# Patient Record
Sex: Female | Born: 1985
Health system: Southern US, Community
[De-identification: ages and names within clinical notes are randomized; demographics above are authoritative.]

## PROBLEM LIST (undated history)

## (undated) DIAGNOSIS — G43909 Migraine, unspecified, not intractable, without status migrainosus: Secondary | ICD-10-CM

## (undated) DIAGNOSIS — E669 Obesity, unspecified: Secondary | ICD-10-CM

## (undated) DIAGNOSIS — K219 Gastro-esophageal reflux disease without esophagitis: Secondary | ICD-10-CM

## (undated) DIAGNOSIS — B029 Zoster without complications: Secondary | ICD-10-CM

## (undated) HISTORY — PX: OTHER SURGICAL HISTORY: SHX169

## (undated) HISTORY — PX: CHOLECYSTECTOMY: SHX55

## (undated) HISTORY — DX: Obesity, unspecified: E66.9

## (undated) HISTORY — DX: Zoster without complications: B02.9

---

## 2004-03-30 ENCOUNTER — Emergency Department (HOSPITAL_COMMUNITY): Admission: EM | Admit: 2004-03-30 | Discharge: 2004-03-30 | Payer: Self-pay | Admitting: Emergency Medicine

## 2005-08-13 ENCOUNTER — Other Ambulatory Visit: Admission: RE | Admit: 2005-08-13 | Discharge: 2005-08-13 | Payer: Self-pay | Admitting: Obstetrics and Gynecology

## 2006-05-30 ENCOUNTER — Other Ambulatory Visit: Admission: RE | Admit: 2006-05-30 | Discharge: 2006-05-30 | Payer: Self-pay | Admitting: Obstetrics and Gynecology

## 2006-10-06 ENCOUNTER — Ambulatory Visit (HOSPITAL_COMMUNITY): Admission: RE | Admit: 2006-10-06 | Discharge: 2006-10-06 | Payer: Self-pay | Admitting: Family Medicine

## 2006-10-14 ENCOUNTER — Ambulatory Visit (HOSPITAL_COMMUNITY): Admission: RE | Admit: 2006-10-14 | Discharge: 2006-10-14 | Payer: Self-pay | Admitting: Family Medicine

## 2006-11-22 ENCOUNTER — Encounter (INDEPENDENT_AMBULATORY_CARE_PROVIDER_SITE_OTHER): Payer: Self-pay | Admitting: Surgery

## 2006-11-22 ENCOUNTER — Ambulatory Visit (HOSPITAL_COMMUNITY): Admission: RE | Admit: 2006-11-22 | Discharge: 2006-11-22 | Payer: Self-pay | Admitting: Surgery

## 2006-11-25 ENCOUNTER — Encounter: Payer: Self-pay | Admitting: Surgery

## 2006-11-25 ENCOUNTER — Inpatient Hospital Stay (HOSPITAL_COMMUNITY): Admission: AD | Admit: 2006-11-25 | Discharge: 2006-11-28 | Payer: Self-pay | Admitting: Surgery

## 2007-08-14 ENCOUNTER — Inpatient Hospital Stay (HOSPITAL_COMMUNITY): Admission: RE | Admit: 2007-08-14 | Discharge: 2007-08-14 | Payer: Self-pay | Admitting: Obstetrics and Gynecology

## 2007-12-13 ENCOUNTER — Inpatient Hospital Stay (HOSPITAL_COMMUNITY): Admission: AD | Admit: 2007-12-13 | Discharge: 2007-12-15 | Payer: Self-pay | Admitting: Obstetrics and Gynecology

## 2007-12-25 ENCOUNTER — Inpatient Hospital Stay (HOSPITAL_COMMUNITY): Admission: AD | Admit: 2007-12-25 | Discharge: 2007-12-28 | Payer: Self-pay | Admitting: Obstetrics and Gynecology

## 2008-01-17 ENCOUNTER — Emergency Department (HOSPITAL_COMMUNITY): Admission: EM | Admit: 2008-01-17 | Discharge: 2008-01-17 | Payer: Self-pay | Admitting: *Deleted

## 2008-10-01 ENCOUNTER — Emergency Department (HOSPITAL_COMMUNITY): Admission: EM | Admit: 2008-10-01 | Discharge: 2008-10-02 | Payer: Self-pay | Admitting: Emergency Medicine

## 2008-10-23 ENCOUNTER — Emergency Department (HOSPITAL_COMMUNITY): Admission: EM | Admit: 2008-10-23 | Discharge: 2008-10-23 | Payer: Self-pay | Admitting: Emergency Medicine

## 2008-10-25 ENCOUNTER — Encounter: Admission: RE | Admit: 2008-10-25 | Discharge: 2008-10-25 | Payer: Self-pay | Admitting: Gastroenterology

## 2008-11-01 ENCOUNTER — Ambulatory Visit (HOSPITAL_COMMUNITY): Admission: RE | Admit: 2008-11-01 | Discharge: 2008-11-01 | Payer: Self-pay | Admitting: Gastroenterology

## 2009-12-19 ENCOUNTER — Ambulatory Visit: Payer: Self-pay | Admitting: Family

## 2009-12-19 ENCOUNTER — Inpatient Hospital Stay (HOSPITAL_COMMUNITY): Admission: AD | Admit: 2009-12-19 | Discharge: 2009-12-19 | Payer: Self-pay | Admitting: Obstetrics & Gynecology

## 2010-01-20 ENCOUNTER — Inpatient Hospital Stay (HOSPITAL_COMMUNITY)
Admission: AD | Admit: 2010-01-20 | Discharge: 2010-01-22 | Payer: Self-pay | Source: Home / Self Care | Admitting: Obstetrics and Gynecology

## 2010-05-18 ENCOUNTER — Encounter: Payer: Self-pay | Admitting: Gastroenterology

## 2010-07-09 LAB — RPR: RPR Ser Ql: NONREACTIVE

## 2010-07-09 LAB — CBC
HCT: 29.5 % — ABNORMAL LOW (ref 36.0–46.0)
Hemoglobin: 12.4 g/dL (ref 12.0–15.0)
MCH: 27 pg (ref 26.0–34.0)
Platelets: 134 10*3/uL — ABNORMAL LOW (ref 150–400)
Platelets: 97 10*3/uL — ABNORMAL LOW (ref 150–400)
RBC: 3.68 MIL/uL — ABNORMAL LOW (ref 3.87–5.11)
RDW: 14.9 % (ref 11.5–15.5)
WBC: 12.9 10*3/uL — ABNORMAL HIGH (ref 4.0–10.5)

## 2010-07-10 LAB — COMPREHENSIVE METABOLIC PANEL
BUN: 3 mg/dL — ABNORMAL LOW (ref 6–23)
Calcium: 8.5 mg/dL (ref 8.4–10.5)
Chloride: 108 mEq/L (ref 96–112)
Creatinine, Ser: 0.38 mg/dL — ABNORMAL LOW (ref 0.4–1.2)
Glucose, Bld: 89 mg/dL (ref 70–99)
Sodium: 136 mEq/L (ref 135–145)
Total Bilirubin: 0.5 mg/dL (ref 0.3–1.2)
Total Protein: 5.8 g/dL — ABNORMAL LOW (ref 6.0–8.3)

## 2010-07-10 LAB — CBC
HCT: 34.7 % — ABNORMAL LOW (ref 36.0–46.0)
Hemoglobin: 11.7 g/dL — ABNORMAL LOW (ref 12.0–15.0)
MCHC: 33.7 g/dL (ref 30.0–36.0)
RBC: 4.18 MIL/uL (ref 3.87–5.11)
RDW: 14 % (ref 11.5–15.5)

## 2010-07-10 LAB — URINE MICROSCOPIC-ADD ON

## 2010-07-10 LAB — URINALYSIS, ROUTINE W REFLEX MICROSCOPIC
Hgb urine dipstick: NEGATIVE
Leukocytes, UA: NEGATIVE
Protein, ur: NEGATIVE mg/dL
Specific Gravity, Urine: 1.025 (ref 1.005–1.030)

## 2010-07-10 LAB — LIPASE, BLOOD: Lipase: 27 U/L (ref 11–59)

## 2010-07-10 LAB — AMYLASE: Amylase: 52 U/L (ref 0–105)

## 2010-08-03 LAB — WET PREP, GENITAL
Trich, Wet Prep: NONE SEEN
Yeast Wet Prep HPF POC: NONE SEEN

## 2010-08-03 LAB — URINALYSIS, ROUTINE W REFLEX MICROSCOPIC
Bilirubin Urine: NEGATIVE
Bilirubin Urine: NEGATIVE
Glucose, UA: NEGATIVE mg/dL
Glucose, UA: NEGATIVE mg/dL
Hgb urine dipstick: NEGATIVE
Hgb urine dipstick: NEGATIVE
Ketones, ur: NEGATIVE mg/dL
Nitrite: NEGATIVE
Protein, ur: NEGATIVE mg/dL
Protein, ur: NEGATIVE mg/dL
Specific Gravity, Urine: 1.008 (ref 1.005–1.030)
Urobilinogen, UA: 0.2 mg/dL (ref 0.0–1.0)
Urobilinogen, UA: 1 mg/dL (ref 0.0–1.0)
pH: 6 (ref 5.0–8.0)

## 2010-08-03 LAB — CBC
HCT: 38.4 % (ref 36.0–46.0)
HCT: 40.1 % (ref 36.0–46.0)
Hemoglobin: 13 g/dL (ref 12.0–15.0)
MCHC: 33.8 g/dL (ref 30.0–36.0)
MCV: 87.7 fL (ref 78.0–100.0)
Platelets: 248 10*3/uL (ref 150–400)
Platelets: 228 10*3/uL (ref 150–400)
RBC: 4.38 MIL/uL (ref 3.87–5.11)
RDW: 12.4 % (ref 11.5–15.5)
WBC: 9.5 10*3/uL (ref 4.0–10.5)
WBC: 10.9 10*3/uL — ABNORMAL HIGH (ref 4.0–10.5)

## 2010-08-03 LAB — COMPREHENSIVE METABOLIC PANEL WITH GFR
ALT: 12 U/L (ref 0–35)
AST: 20 U/L (ref 0–37)
Albumin: 3.9 g/dL (ref 3.5–5.2)
Alkaline Phosphatase: 157 U/L — ABNORMAL HIGH (ref 39–117)
BUN: 11 mg/dL (ref 6–23)
CO2: 28 meq/L (ref 19–32)
Calcium: 9.2 mg/dL (ref 8.4–10.5)
Chloride: 103 meq/L (ref 96–112)
Creatinine, Ser: 0.68 mg/dL (ref 0.4–1.2)
GFR calc non Af Amer: >60 mL/min
Glucose, Bld: 98 mg/dL (ref 70–99)
Potassium: 3.7 meq/L (ref 3.5–5.1)
Sodium: 139 meq/L (ref 135–145)
Total Bilirubin: 0.5 mg/dL (ref 0.3–1.2)
Total Protein: 7.5 g/dL (ref 6.0–8.3)

## 2010-08-03 LAB — DIFFERENTIAL
Basophils Absolute: 0.1 10*3/uL (ref 0.0–0.1)
Basophils Absolute: 0.1 10*3/uL (ref 0.0–0.1)
Basophils Relative: 1 % (ref 0–1)
Basophils Relative: 1 % (ref 0–1)
Eosinophils Absolute: 0.1 10*3/uL (ref 0.0–0.7)
Eosinophils Absolute: 0.1 10*3/uL (ref 0.0–0.7)
Eosinophils Relative: 1 % (ref 0–5)
Eosinophils Relative: 1 % (ref 0–5)
Lymphocytes Relative: 27 % (ref 12–46)
Lymphs Abs: 2.5 10*3/uL (ref 0.7–4.0)
Monocytes Absolute: 0.4 10*3/uL (ref 0.1–1.0)
Monocytes Absolute: 0.6 10*3/uL (ref 0.1–1.0)
Monocytes Relative: 4 % (ref 3–12)
Monocytes Relative: 6 % (ref 3–12)
Neutro Abs: 6.5 10*3/uL (ref 1.7–7.7)
Neutrophils Relative %: 68 % (ref 43–77)

## 2010-08-03 LAB — POCT PREGNANCY, URINE
Preg Test, Ur: NEGATIVE
Preg Test, Ur: NEGATIVE

## 2010-08-03 LAB — COMPREHENSIVE METABOLIC PANEL
ALT: 10 U/L (ref 0–35)
CO2: 25 mEq/L (ref 19–32)
Calcium: 9.1 mg/dL (ref 8.4–10.5)
Creatinine, Ser: 0.77 mg/dL (ref 0.4–1.2)
GFR calc non Af Amer: >60 mL/min (ref >60)
Glucose, Bld: 112 mg/dL — ABNORMAL HIGH (ref 70–99)

## 2010-08-03 LAB — LIPASE, BLOOD: Lipase: 25 U/L (ref 11–59)

## 2010-09-08 NOTE — Discharge Summary (Signed)
[name], [name] NO.:  [id number]   MEDICAL RECORD NO.:  [id number]          PATIENT TYPE:  INP   LOCATION:  6703                         FACILITY:  [facility]   PHYSICIAN:  Ardeth Sportsman, MD     DATE OF BIRTH:  1985-05-31   DATE OF ADMISSION:  11/25/2006  DATE OF DISCHARGE:  11/28/2006                               DISCHARGE SUMMARY   PRIMARY CARE PHYSICIAN:  Royetta Crochet, MD   SURGEON:  Ardeth Sportsman, MD   DISCHARGE DIAGNOSES:  1. Gastroenteritis.  2. Gastroesophageal reflux disease worsened by narcotic and      nonsteroidal use.  3. Biliary dyskinesia status post laparoscopic cholecystectomy on November 22, 2006.   MAIN DIAGNOSIS:  Gastroenteritis.   SUMMARY OF HOSPITAL COURSE:  [name] is a pleasant 25 year old female  who had worsening abdominal pain and biliary dyskinesia and underwent a  laparoscopic cholecystectomy.  Unfortunately, she started having  worsening upper abdominal pain and nausea and vomiting.  She is also  having flushing and shakes on Percocet.  Based on concerns, despite  normal white count, she did have some slightly increased liver function  tests.  She underwent an ultrasound, which did not seem to be too  revealing, but we asked her to come to the office to be seen.  My  partner, Dr. Ezzard Standing, was concerned, and he and Dr. Luisa Hart agreed to  admit her while I was not available.   She was hydrated.  She underwent a HIDA scan, which was completely  negative for any bile leak.  She underwent a CT scan of the abdomen and  pelvis, which showed no evidence of any perforation, bowel obstruction,  abscess, biloma, or any other concerns.  She was left on parenteral  fluids and narcotic pain medication.  She started having flatus and  bowel movements.  Her abdominal pain decreased.  She was advanced on a  diet, and by the time of discharge seemed to be tolerating a solid diet  rather well.  She was not having any fevers or  chills.  She was walking  around the room well and needing minimal pain medication.   Based on these improvements, we thought it would be reasonable for her  to be discharged with the following instructions:  1. She is to return to the clinic to see me in about 7-10 days with      her original kept appointment.  2. She should avoid work for at least a week until she is more fully      recovered.  3. She should avoid any aspirin or nonsteroidals for several weeks      until she is more fully recovered.  4. She should increase her Aciphex to twice-a-day dosing for the next      week for better reflux control.  5. See can advance her diet to a more regular diet over the next few      days, taking care to stay hydrated and avoid      pushing too much.  6. She should  call us if she has any fever, chills, sweats, worsening      nausea or vomiting, abdominal pain, diarrhea, severe constipation,      or any other concerns.      Ardeth Sportsman, MD  Electronically Signed     SCG/MEDQ  D:  11/28/2006  T:  11/28/2006  Job:  [id number]   cc:   Royetta Crochet, MD

## 2010-09-08 NOTE — Discharge Summary (Signed)
[name], [name]              ACCOUNT NO.:  [id number]   MEDICAL RECORD NO.:  [id number]          PATIENT TYPE:  INP   LOCATION:  9110                          FACILITY:  [facility]   PHYSICIAN:  Gerrit Friends. Aldona Bar, M.D.   DATE OF BIRTH:  [birth year]   DATE OF ADMISSION:  12/25/2007  DATE OF DISCHARGE:  12/28/2007                               DISCHARGE SUMMARY   DISCHARGE DIAGNOSES:  1. Term pregnancy delivered 7 pounds 5 ounces female infant, Apgars 9      and 10.  2. Blood type O positive.   PROCEDURES:  1. Normal spontaneous delivery.  2. Second-degree tear and repair.   SUMMARY:  This 25 year old primigravida was admitted at [redacted] weeks  gestation with ruptured membranes after a relatively benign pregnancy  with the exception of a lot of preterm contractions.  She was  hospitalized in mid August for several days and placed on Procardia.   At the time of her admission, her cervix was 1 cm dilated, 50% effaced,  and the vertex at -2 station and ruptured membranes were documented.  She progressed and ultimately on the morning of December 26, 2007,  delivered a female infant weighing 7 pounds and 5 ounces with Apgars of 9  and 10 over second-degree tear which was repaired without difficulty.  Her postpartum course was uncomplicated.  Her discharge hemoglobin was  11.5 with a white count of 15,300 and a platelet count of 156,000.  On  the morning of December 28, 2007, she was ambulating well, tolerating a  regular diet well, having normal bowel and bladder function, and was  afebrile.  Her breast-feeding was going well and she was desirous of  discharge.  Accordingly, she was given all appropriate instructions per  discharge brochure and understood all instructions well.   DISCHARGE MEDICATIONS:  1. Vitamins - 1 a day as long she is breast-feeding.  2. Lorcet 10/650 1 every 4-6 hours as needed for discomfort.   She will resume her Nexium which she takes in the morning daily for  reflux.   She will return to the office for followup in approximately 4  weeks' time or as needed.   CONDITION ON DISCHARGE:  Improved.      Gerrit Friends. Aldona Bar, M.D.  Electronically Signed     RMW/MEDQ  D:  12/28/2007  T:  12/28/2007  Job:  [id number]

## 2010-09-08 NOTE — H&P (Signed)
NAMEKAMIL, [name]              ACCOUNT NO.:  [id number]   MEDICAL RECORD NO.:  [id number]          PATIENT TYPE:  OUT   LOCATION:  XRAY                         [facility]ACILITY:  [facility]   PHYSICIAN:  Sandria Bales. Ezzard Standing, M.D.  DATE OF BIRTH:  September 27, 1985   DATE OF ADMISSION:  11/25/2006  DATE OF DISCHARGE:                              HISTORY & PHYSICAL   HISTORY OF ILLNESS:  Ms.  [name] is a 25 year old white female who had  history of biliary colic with an abnormal ejection fraction on the  hepatobiliary scan.  She underwent a laparoscopic cholecystectomy on the  29th July, 2008 by Dr. Michaell Cowing.  He was unable to do a cholangiogram at  the time of surgery.  She was discharged to home on the same day, got  better may be for a day, but over the last 2 days, had had increasing  right upper quadrant pain radiating to her back and nausea.   Today, Dr. Michaell Cowing obtained an ultrasound.  I only have a handwritten  report that showed common bile duct of 6.1 mm.  There was no fluid.  Otherwise, negative.  She also had labs done, which showed a white blood  cell count of 8,800, a bilirubin of 0.9, an alk phos of 183, an SGOT of  147, SGPT of 334.  The lipase is 19.  She came to the origin office on  Friday, November 25, 2006 because of worsening abdominal pain in Dr.  Michaell Cowing' office afternoon.   ALLERGIES:  SHE HAS NO KNOWN ALLERGIES.   MEDICATIONS:  The only medicine she was taking was Aciphex.  She is also  taking some nonsteroidal drugs.   REVIEW OF SYSTEMS:  Otherwise uneventful.  She has no significant  cardiac, pulmonary, or urologic problems.  She is accompanied by her  husband.   PHYSICAL EXAMINATION:  VITAL SIGNS:  Her temperature is 97.4, blood  pressure 132/80, pulse is 72.  GENERAL:  She is somewhat flushed in the face.  She is a well-nourished  white female, alert, and cooperative.  NECK:  Supple.  LUNGS:  Clear to auscultation.  HEART:  Regular rate and rhythm without murmur or rub.  She  is not  tachycardic.  She does not look toxic, but she looks somewhat flushed.  ABDOMEN:  Her abdomen showed some present bowel sounds, but she is  tender in the right upper quadrant with bandages over her wound.  EXTREMITIES:  She has good strength in all 4 extremities.  NEUROLOGIC:  Grossly intact.   Dr. Luisa Hart who is on-call was in the office.  He also looked at her  with me and I think she is best served by being admitted for IV  hydration, pain control, and we are going to order a HIDA scan and CT  scan today for further evaluation.   DIAGNOSIS:  Right upper quadrant post cholecystectomy, possible leak.   PLAN:  HIDA scan/CT scan and admission to hospital.  I discussed this  with the patient and her husband, who understand.      Sandria Bales. Ezzard Standing, M.D.  Electronically Signed  DHN/MEDQ  D:  11/25/2006  T:  11/25/2006  Job:  [id number]   cc:   Ardeth Sportsman, MD  Royetta Crochet, MD

## 2010-09-08 NOTE — Op Note (Signed)
[name], [name]              ACCOUNT NO.:  [id number]   MEDICAL RECORD NO.:  [id number]          PATIENT TYPE:  AMB   LOCATION:  SDS                          FACILITY:  [facility]   PHYSICIAN:  Ardeth Sportsman, MD     DATE OF BIRTH:  1985-08-02   DATE OF PROCEDURE:  11/22/2006  DATE OF DISCHARGE:                               OPERATIVE REPORT   PRIMARY CARE PHYSICIAN:  Royetta Crochet, MD   SURGEON:  Ardeth Sportsman, M.D.   ASSISTANT:  None.   PREOPERATIVE DIAGNOSIS:  Biliary dyskinesia, probable chronic  cholecystitis.   POSTOPERATIVE DIAGNOSIS:  Biliary dyskinesia, probable chronic  cholecystitis.   PROCEDURE PERFORMED:  Laparoscopic cholecystectomy.   ANESTHESIA:  1. General anesthesia.  2. Local anesthetic and field block around all port sites.   SPECIMEN:  Gallbladder.   DRAINS:  None.   ESTIMATED BLOOD LOSS:  Less than 5 mL.   COMPLICATIONS:  None apparent.   INDICATION:  [name] is a 25 year old female with classic episodes of  biliary colic and workup, otherwise, negative aside from some mild  gastroesophageal reflux well controlled on proton pump inhibitors.  She  as found to have decreased gallbladder ejection fraction with  reproduction of symptoms.  She had an upper GI study that was negative  as well.   The anatomy and physiology of hepatobiliary and pancreatic function was  explained, pathophysiology of biliary dyskinesia with gallbladder  emptying dysfunction and resulting worsening abdominal pain and  dehydration and calculus cholecystitis.  Risks and options are  discussed.  Recommendations are made for laparoscopic cholecystectomy  with possible intraoperative cholangiogram.  Risks such as stroke, heart  attack, deep venous thrombosis, pulmonary embolism and death were  discussed.  Risks such as bleeding, need for transfusion, wound  infection, abscess, injury to other organs, prolonged pain, bile duct  injuries, need of drainage or stenting  or operative reconstruction was  discussed as well.  Questions answered, and she agreed to proceed.   OPERATIVE FINDINGS:  She had some moderate adhesions of omentum to her  gallbladder but no massive gallbladder wall thickening.  Her gallbladder  was very intrahepatic.  Her cystic duct was extremely stenosed.  It  would not allow a cholangiogram catheter to pass into the common bile  duct.   DESCRIPTION OF PROCEDURE:  Informed consent was confirmed.  The patient  voided just prior to going to the operating room.  She had sequential  compression devices active during the entire case.  She was positioned  supine with both arms tucked.  Her abdomen was prepped and draped in a  sterile fashion.   Entry was gained in the abdomen with the patient in steep Trendelenburg  position and right side up using an optical entry technique with a 5-  mm/0 degree scope.  Camera inspection revealed no intraabdominal injury.  Under direct visualization, 5-mm ports were placed through the umbilicus  and in the right flank.  A 10-mm port was tunneled through the falciform  ligament in the subxiphoid region.   The gallbladder fundus was grasped and elevated cephalad.  Adhesions  were carefully freed off primarily involving the omentum.  The  peritoneal covering between the gallbladder and liver were freed off in  the anteromedial and posterolateral aspects.  Circumferential dissection  was done to free the proximal 1/3 of the gallbladder off of the liver  bed.  Dissection revealed three structures going form the gallbladder  down to the porta hepatis.  Two were pulsatile branches, one going on  the anteromedial and going on the posterolateral wall consistent with  anterior & posterior branches of the cystic artery.  Once clip on the  gallbladder side and two clips slightly proximal were made on these  structures.  These structures were transected with left lone structure  leaving and then going from the  infundibulum down to the porta hepatis,  consistent with a short cystic duct.  Clip was made on the infundibulum  and partial cysticotomy was performed.   A 5-French cholangiocatheter was passed through a subcostal stab  incision and flushed.  Numerous attempts were made to pass the catheter  through the cystic duct but I could not get it to advance more than 5 mm  and I could not get a seal to do a cholangiogram.  Further  skeletonization dissection was done of the cystic duct to try and have  better straighten out.  It was a short cystic duct.  Attempt was made to  gently milk back any possible stones or obstructions at the cystic  duct/common bile duct junction.  However, none of these maneuvers were  successful.  Because the patient did not have any obvious jaundice or  elevated LFTs and the anatomy was otherwise normal, I aborted doing the  cholangiogram.  Cholangiocatheter was removed.  Four clips were made on  the cystic duct stump taking care to stay from the cystic duct junction.  Cystic duct transection was completed pretty much at the infundibulum.  The gallbladder was freed from its remaining attachments on the  gallbladder.  The gallbladder was freed from its remaining attachments  to the liver and removed out the subxiphoid port.  There was a slight  exposure of bile but no stones.  Copious irrigation was done in the  subxiphoid port.  Fascial defect was too small to allow my pinky to pass  so I did not do any more aggressive fascial closure.   Careful hemostasis was made on the liver bed and there was no evidence  of any leak of bile of bleeding on the liver bed or on the surgical  clips.  Copious irrigation, over a liter, was done with clear return  with no evidence of any continued bile or blood contamination.  The  upper three abdominal ports were removed under direct visualization,  there was no bleeding on the peritoneal or skin side.  Capnoperitoneum  was evacuated.   The umbilical port was removed.  The skin port sites  were irrigated copiously with saline and the skin was closed using 4-0  Monocryl stitch.  A sterile dressing was applied.  The patient was  extubated and sent to recovery room in stable condition.    I explained the operative findings to the patient just prior to the  patient's family.  Postoperative instructions have been discussed with  the patient just prior to surgery and also with the family just right  after surgery.  Questions answered and expressed understanding and  appreciation.      Ardeth Sportsman, MD  Electronically Signed  SCG/MEDQ  D:  11/22/2006  T:  11/22/2006  Job:  [id number]   cc:   Royetta Crochet, MD  Melida Quitter, M.D.

## 2010-09-11 NOTE — Discharge Summary (Signed)
NAMELAPRECIOUS, AUSTILL NO.:  [id number]   MEDICAL RECORD NO.:  [id number]          PATIENT TYPE:  INP   LOCATION:  9156                          FACILITY:  [facility]   PHYSICIAN:  Ilda Mori, M.D.   DATE OF BIRTH:  November 06, 1985   DATE OF ADMISSION:  12/13/2007  DATE OF DISCHARGE:  12/15/2007                               DISCHARGE SUMMARY   FINAL DIAGNOSIS:  Preterm labor.   SECONDARY DIAGNOSIS:  A 34-week pregnancy.   PROCEDURES:  Tocolysis for preterm labor.   CONDITION ON DISCHARGE:  Stable.   This is a 25 year old gravida 1 with estimated date of delivery 09/22,  at 34-1/2 weeks who was admitted through the emergency room with regular  uterine contractions.  Attempts to stop her labor with Procardia in MAU  were unsuccessful, and the patient was admitted for magnesium therapy.  On admission, the patient's cervix was closed, but her lower uterine  segment seemed well developed.  The patient was observed and treated for  48 hours.  She did dilate at the end of the first day to 1 cm, 60%  effaced.  However, on the third hospital day, she was stable.  There was  no cervical change, and her contractions stopped.  The magnesium was  stopped in the morning of December 15, 2007 and after 6 hours when it was  clear that she was stable she was discharged.  She was sent home on  Procardia 10 mg every 6-8 hours as needed, told to eat a regular diet,  limit her activities, and to return to the office in 3 days.   LABORATORY DATA:  Group B strep came back negative.  An ultrasound was  performed, which showed a 5-pound, 13-ounce baby that appeared to be  normal with normal amniotic fluid volume.  Her fetal fibronectin did  come back positive.      Ilda Mori, M.D.  Electronically Signed     RK/MEDQ  D:  02/01/2008  T:  02/02/2008  Job:  [id number]

## 2011-01-19 LAB — URINALYSIS, ROUTINE W REFLEX MICROSCOPIC
Glucose, UA: NEGATIVE
Hgb urine dipstick: NEGATIVE
Ketones, ur: NEGATIVE
Protein, ur: NEGATIVE
Urobilinogen, UA: 0.2

## 2011-01-25 LAB — COMPREHENSIVE METABOLIC PANEL
Albumin: 3.4 — ABNORMAL LOW
Alkaline Phosphatase: 169 — ABNORMAL HIGH
BUN: 7
Calcium: 9.4
Glucose, Bld: 96
Potassium: 3 — ABNORMAL LOW
Total Protein: 6.2

## 2011-01-25 LAB — POCT CARDIAC MARKERS
CKMB, poc: <1 — ABNORMAL LOW
Myoglobin, poc: 36.4
Troponin i, poc: <0.05

## 2011-01-25 LAB — DIFFERENTIAL
Lymphocytes Relative: 22
Lymphs Abs: 1.8
Monocytes Absolute: 0.4
Monocytes Relative: 5
Neutro Abs: 6
Neutrophils Relative %: 72

## 2011-01-25 LAB — POCT PREGNANCY, URINE: Preg Test, Ur: NEGATIVE

## 2011-01-25 LAB — CBC
HCT: 36.2
Hemoglobin: 12.1
MCHC: 33.6
Platelets: 233
RDW: 12.3

## 2011-01-27 LAB — CBC
Hemoglobin: 11.5 — ABNORMAL LOW
MCHC: 33.8
RBC: 3.81 — ABNORMAL LOW
WBC: 15.3 — ABNORMAL HIGH

## 2011-02-08 LAB — CBC
HCT: 40.7
HCT: 41.7
MCHC: 33.9
MCHC: 34.4
MCHC: 34.2
MCV: 86.9
MCV: 86.6
Platelets: 270
Platelets: 278
RBC: 4.81
RDW: 12.2
WBC: 8.4
WBC: 7.7

## 2011-02-08 LAB — DIFFERENTIAL
Basophils Relative: 0
Eosinophils Absolute: 0.1
Lymphs Abs: 2.7
Monocytes Absolute: 0.5
Monocytes Relative: 6
Neutro Abs: 5.5
Neutrophils Relative %: 62

## 2011-02-08 LAB — HEPATIC FUNCTION PANEL
AST: 157 — ABNORMAL HIGH
Albumin: 3.5
Alkaline Phosphatase: 205 — ABNORMAL HIGH
Total Bilirubin: 0.7
Total Protein: 6.4

## 2011-02-08 LAB — COMPREHENSIVE METABOLIC PANEL
ALT: 208 — ABNORMAL HIGH
ALT: 334 — ABNORMAL HIGH
AST: 48 — ABNORMAL HIGH
Albumin: 3.6
Albumin: 3.9
Alkaline Phosphatase: 179 — ABNORMAL HIGH
Alkaline Phosphatase: 183 — ABNORMAL HIGH
CO2: 27
Calcium: 9.5
Chloride: 102
Creatinine, Ser: 0.57
GFR calc Af Amer: >60
GFR calc non Af Amer: >60
Potassium: 3.7
Potassium: 4.1
Sodium: 134 — ABNORMAL LOW
Sodium: 138
Total Bilirubin: 0.4
Total Protein: 7.5

## 2011-02-08 LAB — HCG, SERUM, QUALITATIVE: Preg, Serum: NEGATIVE

## 2011-04-17 ENCOUNTER — Emergency Department
Admission: EM | Admit: 2011-04-17 | Discharge: 2011-04-17 | Disposition: A | Discharge status: Home / Self Care | Payer: 59 | Source: Home / Self Care | Attending: Emergency Medicine | Admitting: Emergency Medicine

## 2011-04-17 ENCOUNTER — Encounter: Payer: Self-pay | Admitting: Emergency Medicine

## 2011-04-17 DIAGNOSIS — J069 Acute upper respiratory infection, unspecified: Secondary | ICD-10-CM

## 2011-04-17 DIAGNOSIS — J209 Acute bronchitis, unspecified: Secondary | ICD-10-CM

## 2011-04-17 DIAGNOSIS — R059 Cough, unspecified: Secondary | ICD-10-CM

## 2011-04-17 DIAGNOSIS — R05 Cough: Secondary | ICD-10-CM

## 2011-04-17 HISTORY — DX: Migraine, unspecified, not intractable, without status migrainosus: G43.909

## 2011-04-17 MED ORDER — AZITHROMYCIN 250 MG PO TABS: ORAL_TABLET | ORAL | Status: AC

## 2011-04-17 MED ORDER — GUAIFENESIN-CODEINE 100-10 MG/5ML PO SYRP: 5.0000 mL | ORAL_SOLUTION | Freq: Four times a day (QID) | ORAL | Status: AC | PRN

## 2011-04-17 NOTE — ED Provider Notes (Signed)
History     CSN: [id number]  Arrival date & time 04/17/11  1000   First MD Initiated Contact with Patient 04/17/11 1031      Chief Complaint  Patient presents with  . Cough    (Consider location/radiation/quality/duration/timing/severity/associated sxs/prior treatment) HPI [name] is a 25 y.o. female who complains of onset of cold symptoms for 2-3 days. She did not have a flu shot, however she went [facility] to [facility] and they gave her an inhaler as well as a flu test was negative. + sore throat + cough No pleuritic pain No wheezing + nasal congestion + post-nasal drainage + sinus pain/pressure + chest congestion No itchy/red eyes + earache No hemoptysis No SOB + chills/sweats + fever No nausea No vomiting No abdominal pain No diarrhea No skin rashes + fatigue + myalgias + headache    Past Medical History  Diagnosis Date  . Asthma   . Migraines     Past Surgical History  Procedure Date  . Knee surgeries     No family history on file.  History  Substance Use Topics  . Smoking status: Never Smoker   . Smokeless tobacco: Not on file  . Alcohol Use: No    OB History    Grav Para Term Preterm Abortions TAB SAB Ect Mult Living                  Review of Systems  Allergies  Dilaudid and Morphine and related  Home Medications   Current Outpatient Rx  Name Route Sig Dispense Refill  . ALBUTEROL SULFATE HFA 108 (90 BASE) MCG/ACT IN AERS Inhalation Inhale 2 puffs into the lungs every 6 (six) hours as needed.      Marland Kitchen ESOMEPRAZOLE MAGNESIUM 20 MG PO CPDR Oral Take 20 mg by mouth daily before breakfast.      . NORGESTIMATE-ETH ESTRADIOL 0.25-35 MG-MCG PO TABS Oral Take 1 tablet by mouth daily.        BP 118/79  Pulse 103  Temp(Src) 98.3 F (36.8 C) (Oral)  Resp 16  Ht 5\' 5"  (1.651 m)  Wt 205 lb (92.987 kg)  BMI 34.11 kg/m2  SpO2 99%  LMP 04/10/2011  Physical Exam  Nursing note and vitals reviewed. Constitutional: She is  oriented to person, place, and time. She appears well-developed and well-nourished.  HENT:  Head: Normocephalic and atraumatic.  Right Ear: Tympanic membrane, external ear and ear canal normal.  Left Ear: Tympanic membrane, external ear and ear canal normal.  Nose: Mucosal edema and rhinorrhea present.  Mouth/Throat: Posterior oropharyngeal erythema present. No oropharyngeal exudate or posterior oropharyngeal edema.  Eyes: No scleral icterus.  Neck: Neck supple.  Cardiovascular: Regular rhythm and normal heart sounds.   Pulmonary/Chest: Effort normal and breath sounds normal. No respiratory distress.  Neurological: She is alert and oriented to person, place, and time.  Skin: Skin is warm and dry.  Psychiatric: She has a normal mood and affect. Her speech is normal.    ED Course  Procedures (including critical care time)  Labs Reviewed - No data to display No results found.   No diagnosis found.    MDM  1)  Take the prescribed antibiotic as instructed.  She certainly has signs and symptoms of influenza, however she has had a negative flu test and treat her instead for bronchitis. Even if this fluid has been 3 days for Tamiflu. 2)  Use nasal saline solution (over the counter) at least 3 times a  day. 3)  Use over the counter decongestants like Zyrtec-D every 12 hours as needed to help with congestion.  If you have hypertension, do not take medicines with sudafed.  4)  Can take tylenol every 6 hours or motrin every 8 hours for pain or fever. 5)  Follow up with your primary doctor if no improvement in 5-7 days, sooner if increasing pain, fever, or new symptoms.       Lily Kocher, MD 04/17/11 [phone]

## 2011-04-17 NOTE — ED Notes (Signed)
Coughing, fever, fatigue, ear pain, congestion x 24 hours. No Flu vaccine this season.

## 2011-06-21 ENCOUNTER — Emergency Department (INDEPENDENT_AMBULATORY_CARE_PROVIDER_SITE_OTHER)
Admission: EM | Admit: 2011-06-21 | Discharge: 2011-06-21 | Disposition: A | Discharge status: Home / Self Care | Payer: 59 | Source: Home / Self Care | Attending: Family Medicine | Admitting: Family Medicine

## 2011-06-21 ENCOUNTER — Encounter: Payer: Self-pay | Admitting: Emergency Medicine

## 2011-06-21 DIAGNOSIS — G43819 Other migraine, intractable, without status migrainosus: Secondary | ICD-10-CM

## 2011-06-21 DIAGNOSIS — M542 Cervicalgia: Secondary | ICD-10-CM

## 2011-06-21 DIAGNOSIS — G43119 Migraine with aura, intractable, without status migrainosus: Secondary | ICD-10-CM

## 2011-06-21 MED ORDER — KETOROLAC TROMETHAMINE 60 MG/2ML IM SOLN
60.0000 mg | Freq: Once | INTRAMUSCULAR | Status: AC
  Administered 2011-06-21: 60 mg via INTRAMUSCULAR

## 2011-06-21 MED ORDER — ONDANSETRON HCL 4 MG PO TABS
4.0000 mg | ORAL_TABLET | Freq: Once | ORAL | Status: AC
  Administered 2011-06-21: 4 mg via ORAL

## 2011-06-21 NOTE — ED Provider Notes (Signed)
History     CSN: [id number]  Arrival date & time 06/21/11  0807   First MD Initiated Contact with Patient 06/21/11 0827      Chief Complaint  Patient presents with  . Migraine     HPI Comments: Patient has a long history of recurring right side migraine headaches, followed by headache specialist Dr. Gaynell Face.  Over the past two months her headaches have gradually become more frequent, now occurring every 3 to 5 days; in between headaches she feels constantly off balance and dizzy.  In the past she would try Ibuprofen at onset of a headache, sometimes helpful. Her present headache started 6 days ago, longer than usual, and has not responded to an injection of Sumavel DosePro.  She also has had nausea, without vomiting.   She has been prescribed several prophylactic daily meds in the past, none of which has been helpful she states. She states that her headaches always begin in her right neck, beneath her right ear, and the area usually feels somewhat swollen.  The headache then gradually involves the right side of her head.  She denies pain with chewing.  No fevers, chills, and sweats She has not had any scanning studies.  Patient is a 26 y.o. female presenting with migraine. The history is provided by the patient.  Migraine This is a recurrent problem. Episode onset: 6 days ago. The problem occurs constantly. The problem has not changed since onset.Associated symptoms include headaches. Pertinent negatives include no chest pain and no abdominal pain. Exacerbated by: light sensitivity. The symptoms are relieved by nothing. Treatments tried: Kellogg. The treatment provided no relief.    Past Medical History  Diagnosis Date  . Asthma   . Migraines     Past Surgical History  Procedure Date  . Knee surgeries     Family History  Problem Relation Age of Onset  . Migraines Other     History  Substance Use Topics  . Smoking status: Never Smoker   . Smokeless tobacco: Not on  file  . Alcohol Use: No    OB History    Grav Para Term Preterm Abortions TAB SAB Ect Mult Living                  Review of Systems  Constitutional: Positive for activity change and appetite change. Negative for fever, chills and fatigue.  HENT: Positive for neck pain and neck stiffness. Negative for hearing loss, ear pain, nosebleeds, congestion, sore throat, facial swelling, rhinorrhea, trouble swallowing, dental problem, postnasal drip, sinus pressure, tinnitus and ear discharge.   Eyes: Positive for photophobia. Negative for visual disturbance.  Respiratory: Negative.   Cardiovascular: Negative for chest pain.  Gastrointestinal: Positive for nausea. Negative for vomiting and abdominal pain.  Genitourinary: Negative.   Skin: Negative.   Neurological: Positive for dizziness, light-headedness and headaches. Negative for seizures, syncope, facial asymmetry, speech difficulty, weakness and numbness.    Allergies  Codeine; Dilaudid; and Morphine and related  Home Medications   Current Outpatient Rx  Name Route Sig Dispense Refill  . ACETAMINOPHEN 325 MG PO TABS Oral Take 650 mg by mouth every 6 (six) hours as needed.    . CYCLOBENZAPRINE HCL ER 15 MG PO CP24 Oral Take 15 mg by mouth daily as needed.    . IBUPROFEN 200 MG PO TABS Oral Take 200 mg by mouth every 6 (six) hours as needed.    . ALBUTEROL SULFATE HFA 108 (90 BASE) MCG/ACT IN AERS Inhalation  Inhale 2 puffs into the lungs every 6 (six) hours as needed.      Marland Kitchen ESOMEPRAZOLE MAGNESIUM 20 MG PO CPDR Oral Take 20 mg by mouth daily before breakfast.      . NORGESTIMATE-ETH ESTRADIOL 0.25-35 MG-MCG PO TABS Oral Take 1 tablet by mouth daily.        BP 112/79  Pulse 79  Temp(Src) 98.6 F (37 C) (Oral)  Resp 16  Ht 5\' 5"  (1.651 m)  Wt 204 lb (92.534 kg)  BMI 33.95 kg/m2  SpO2 98%  LMP 06/14/2011  Physical Exam  Nursing note and vitals reviewed. Constitutional: She is oriented to person, place, and time. She appears  well-developed and well-nourished. No distress.  HENT:  Head: Normocephalic and atraumatic.    Right Ear: Tympanic membrane and external ear normal.  Left Ear: Tympanic membrane and external ear normal.  Nose: Nose normal.  Mouth/Throat: Oropharynx is clear and moist. No oropharyngeal exudate.       There is distinct tenderness over the right mastoid process and right lateral neck.  No swelling or adenopathy, however.   There is no temporal artery tenderness, and no right TMJ tenderness  Eyes: Conjunctivae and EOM are normal. Pupils are equal, round, and reactive to light.       Fundi are benign  Neck: Normal range of motion. Neck supple. No thyromegaly present.  Cardiovascular: Normal rate, regular rhythm and normal heart sounds.  Exam reveals no gallop and no friction rub.   No murmur heard. Pulmonary/Chest: Effort normal and breath sounds normal. No respiratory distress.  Abdominal: Soft. There is no tenderness.  Musculoskeletal: She exhibits no edema.  Lymphadenopathy:    She has no cervical adenopathy.  Neurological: She is alert and oriented to person, place, and time. She has normal reflexes. She displays normal reflexes. No cranial nerve deficit. She exhibits normal muscle tone. Coordination normal.  Skin: Skin is warm and dry. No rash noted.  Psychiatric: She has a normal mood and affect. Her behavior is normal. Thought content normal.    ED Course  Procedures  none      1. Migraine variant, intractable   2. Cervical pain       MDM  Toradol 60mg  IM, and Zofran 4mg  po Arranged MRI neck with contrast, and MRI brain without contrast today.  Results:  Normal MRI of brain.                Normal MR angiography of the carotid and vertebral arteries  Notified patient.  Advised to follow-up with her headache specialist.      Donna Christen, MD 06/21/11 [phone]

## 2011-06-21 NOTE — ED Notes (Signed)
Migraine x 6 days," worse headache I've ever had"

## 2011-06-25 ENCOUNTER — Telehealth: Payer: Self-pay | Admitting: *Deleted

## 2011-07-05 ENCOUNTER — Emergency Department
Admission: EM | Admit: 2011-07-05 | Discharge: 2011-07-05 | Disposition: A | Discharge status: Home / Self Care | Payer: 59 | Source: Home / Self Care | Attending: Family Medicine | Admitting: Family Medicine

## 2011-07-05 DIAGNOSIS — J069 Acute upper respiratory infection, unspecified: Secondary | ICD-10-CM

## 2011-07-05 MED ORDER — BENZONATATE 200 MG PO CAPS: 200.0000 mg | ORAL_CAPSULE | Freq: Every day | ORAL | Status: AC

## 2011-07-05 MED ORDER — AZITHROMYCIN 250 MG PO TABS: ORAL_TABLET | ORAL | Status: AC

## 2011-07-05 NOTE — ED Notes (Signed)
Patient complains of a sore throat x 3 days. She has cough with green sputum, hoarseness, nasal congestion and bilateral ear pressure for 2 days.

## 2011-07-05 NOTE — Discharge Instructions (Signed)
Take Mucinex D (guaifenesin with decongestant) twice daily for congestion.  Increase fluid intake, rest. °May use Afrin nasal spray (or generic oxymetazoline) twice daily for about 5 days.  Also recommend using saline nasal spray several times daily and saline nasal irrigation (AYR is a common brand) °Stop all antihistamines for now, and other non-prescription cough/cold preparations. °Begin Azithromycin if not improving about 5 days or if persistent fever develops. °Follow-up with family doctor if not improving 7 to 10 days.  °

## 2011-07-05 NOTE — ED Provider Notes (Signed)
History     CSN: [id number]  Arrival date & time 07/05/11  1900   First MD Initiated Contact with Patient 07/05/11 1932      Chief Complaint  Patient presents with  . Sore Throat    x 3 days      HPI Comments: Patient complains of approximately 3 day history of gradually progressive URI symptoms beginning with a mild sore throat (now persistent), followed by progressive nasal congestion.  A cough started yesterday.   Complains of fatigue and initial myalgias.  Cough is now worse at night and generally non-productive during the day.  There has been no pleuritic pain, shortness of breath, or wheezes.   The history is provided by the patient.    Past Medical History  Diagnosis Date  . Asthma   . Migraines     Past Surgical History  Procedure Date  . Knee surgeries     Family History  Problem Relation Age of Onset  . Migraines Other   . Bipolar disorder Mother   . Diabetes Other   . Heart failure Other   . Heart attack Other   . Diabetes Other     type 1  . Heart attack Other     History  Substance Use Topics  . Smoking status: Never Smoker   . Smokeless tobacco: Not on file  . Alcohol Use: No    OB History    Grav Para Term Preterm Abortions TAB SAB Ect Mult Living                  Review of Systems + sore throat + cough No pleuritic pain No wheezing + nasal congestion + post-nasal drainage No sinus pain/pressure No itchy/red eyes + earache No hemoptysis No SOB No fever/chills + nausea No vomiting No abdominal pain No diarrhea No urinary symptoms No skin rashes + fatigue + myalgias No headache Used OTC meds without relief  Allergies  Codeine; Dilaudid; and Morphine and related  Home Medications   Current Outpatient Rx  Name Route Sig Dispense Refill  . ACETAMINOPHEN 325 MG PO TABS Oral Take 650 mg by mouth every 6 (six) hours as needed.    . ALBUTEROL SULFATE HFA 108 (90 BASE) MCG/ACT IN AERS Inhalation Inhale 2 puffs into the lungs  every 6 (six) hours as needed.      . CYCLOBENZAPRINE HCL ER 15 MG PO CP24 Oral Take 15 mg by mouth daily as needed.    Marland Kitchen ESOMEPRAZOLE MAGNESIUM 20 MG PO CPDR Oral Take 20 mg by mouth daily before breakfast.      . IBUPROFEN 200 MG PO TABS Oral Take 200 mg by mouth every 6 (six) hours as needed.    Suzzanne Cloud ESTRADIOL 0.25-35 MG-MCG PO TABS Oral Take 1 tablet by mouth daily.      . AZITHROMYCIN 250 MG PO TABS  Take 2 tabs today; then begin one tab once daily for 4 more days (Rx void after 07/13/11) 6 each 0  . BENZONATATE 200 MG PO CAPS Oral Take 1 capsule (200 mg total) by mouth at bedtime. Take as needed for cough 12 capsule 0    BP 122/80  Pulse 87  Temp(Src) 97.7 F (36.5 C) (Oral)  Resp 18  Ht 5\' 5"  (1.651 m)  Wt 205 lb (92.987 kg)  BMI 34.11 kg/m2  SpO2 99%  LMP 06/14/2011  Physical Exam Nursing notes and Vital Signs reviewed. Appearance:  Patient appears stated age, and in no acute  distress.  Patient is obese (BMI 34.2)  Eyes:  Pupils are equal, round, and reactive to light and accomodation.  Extraocular movement is intact.  Conjunctivae are not inflamed  Ears:  Canals normal.  Tympanic membranes normal.  Nose:  Mildly congested turbinates.  No sinus tenderness.    Pharynx:  Minimal erythema Neck:  Supple.  Slightly tender shotty posterior nodes are palpated bilaterally  Lungs:  Clear to auscultation.  Breath sounds are equal.  Heart:  Regular rate and rhythm without murmurs, rubs, or gallops.  Abdomen:  Nontender without masses or hepatosplenomegaly.  Bowel sounds are present.  No CVA or flank tenderness.  Extremities:  No edema.  No calf tenderness Skin:  No rash present.   ED Course  Procedures  none   Labs Reviewed  POCT RAPID STREP A (OFFICE) - Normal      1. Acute upper respiratory infections of unspecified site       MDM  There is no evidence of bacterial infection today.   Treat symptomatically for now  Begin Tessalon at bedtime. Take Mucinex  D (guaifenesin with decongestant) twice daily for congestion.  Increase fluid intake, rest. May use Afrin nasal spray (or generic oxymetazoline) twice daily for about 5 days.  Also recommend using saline nasal spray several times daily and saline nasal irrigation (AYR is a common brand) Stop all antihistamines for now, and other non-prescription cough/cold preparations. Begin Azithromycin if not improving about 5 days or if persistent fever develops (Given a prescription to hold, with an expiration date)  Follow-up with family doctor if not improving 7 to 10 days.         [name], MD 07/06/11 [phone]

## 2011-11-09 ENCOUNTER — Encounter: Payer: Self-pay | Admitting: *Deleted

## 2011-11-09 ENCOUNTER — Emergency Department
Admission: EM | Admit: 2011-11-09 | Discharge: 2011-11-09 | Disposition: A | Discharge status: Home / Self Care | Payer: 59 | Source: Home / Self Care | Attending: Family Medicine | Admitting: Family Medicine

## 2011-11-09 DIAGNOSIS — G43909 Migraine, unspecified, not intractable, without status migrainosus: Secondary | ICD-10-CM

## 2011-11-09 MED ORDER — KETOROLAC TROMETHAMINE 60 MG/2ML IM SOLN
60.0000 mg | Freq: Once | INTRAMUSCULAR | Status: AC
  Administered 2011-11-09: 60 mg via INTRAMUSCULAR

## 2011-11-09 NOTE — ED Provider Notes (Signed)
History     CSN: [id number]  Arrival date & time 11/09/11  4696   First MD Initiated Contact with Patient 11/09/11 2011      Chief Complaint  Patient presents with  . Headache      HPI Comments: Patient c/o HA x 5 days, nausea and neck stiffness today. Denies fever.  She states that she has a long history of migraine headaches that usually occur at onset of her menses.  The headaches are always right-sided, and sometimes respond to ibuprofen 600mg .  She states that if her headaches do not improve after several days, she always has a good response from Toradol.  She has tried preventative medications in the past without success.  Her present headache is typical.    Patient is a 26 y.o. female presenting with migraine. The history is provided by the patient.  Migraine This is a recurrent problem. Episode onset: 5 days ago. The problem occurs constantly. The problem has been gradually worsening. Associated symptoms comments: Nausea without vomiting. Exacerbated by: exposure to light. Nothing relieves the symptoms. Treatments tried: Ibuprofen 600mg . The treatment provided no relief.    Past Medical History  Diagnosis Date  . Asthma   . Migraines     Past Surgical History  Procedure Date  . Knee surgeries     Family History  Problem Relation Age of Onset  . Migraines Other   . Bipolar disorder Mother   . Diabetes Other   . Heart failure Other   . Heart attack Other   . Diabetes Other     type 1  . Heart attack Other     History  Substance Use Topics  . Smoking status: Never Smoker   . Smokeless tobacco: Not on file  . Alcohol Use: No    OB History    Grav Para Term Preterm Abortions TAB SAB Ect Mult Living                  Review of Systems  All other systems reviewed and are negative.    Allergies  Codeine; Dilaudid; and Morphine and related  Home Medications   Current Outpatient Rx  Name Route Sig Dispense Refill  . ACETAMINOPHEN 325 MG PO TABS Oral  Take 650 mg by mouth every 6 (six) hours as needed.    . ALBUTEROL SULFATE HFA 108 (90 BASE) MCG/ACT IN AERS Inhalation Inhale 2 puffs into the lungs every 6 (six) hours as needed.      . CYCLOBENZAPRINE HCL ER 15 MG PO CP24 Oral Take 15 mg by mouth daily as needed.    Marland Kitchen ESOMEPRAZOLE MAGNESIUM 20 MG PO CPDR Oral Take 20 mg by mouth daily before breakfast.      . IBUPROFEN 200 MG PO TABS Oral Take 200 mg by mouth every 6 (six) hours as needed.    Suzzanne Cloud ESTRADIOL 0.25-35 MG-MCG PO TABS Oral Take 1 tablet by mouth daily.        BP 118/81  Pulse 77  Temp 98.1 F (36.7 C) (Oral)  Resp 18  Ht 5\' 4"  (1.626 m)  Wt 205 lb (92.987 kg)  BMI 35.19 kg/m2  SpO2 98%  LMP 11/01/2011  Physical Exam Nursing notes and Vital Signs reviewed. Appearance:  Patient appears stated age, and in no acute distress.  She is alert and oriented.  Patient is obese (BMI 35.2) Eyes:  Pupils are equal, round, and reactive to light and accomodation.  Extraocular movement is intact.  Conjunctivae  are not inflamed.  Fundi are normal.  Ears:  Canals normal.  Tympanic membranes normal.  Nose:  Mildly congested turbinates.  No sinus tenderness.  Mouth/Pharynx:  Normal Neck:  Supple.  No adenopathy or thyromegaly. Lungs:  Clear to auscultation.  Breath sounds are equal.  Heart:  Regular rate and rhythm without murmurs, rubs, or gallops.  Abdomen:  Nontender without masses or hepatosplenomegaly.  Bowel sounds are present.  No CVA or flank tenderness.  Extremities:  No edema.  No calf tenderness Skin:  No rash present.  Neurologic:  Cranial nerves 2 through 12 are normal.  Patellar, achilles, and elbow reflexes are normal.  Cerebellar function is intact (finger-to-nose and rapid alternating hand movement).  Gait and station are normal.   ED Course  Procedures  none      1. Migraine headache; typical for patient.       MDM  Toradol 60mg  IM Continue Zofran as needed for nausea. Suggested that when next  headache occurs, try taking 800mg  Ibuprofen immediately.        [name], MD 11/10/11 1350

## 2011-11-09 NOTE — ED Notes (Signed)
Pt c/o HA x 5 days, nausea and neck stiffness x  Today. Denies fever.

## 2011-11-09 NOTE — Discharge Instructions (Signed)
Continue Zofran as needed for nausea.  Recurrent Migraine Headache You have a recurrent migraine headache. The caregiver can usually provide good relief for this headache. If this headache is the same as your previous migraine headaches, it is safe to treat you without repeating a complete evaluation.  These headaches usually have at least two of the following problems:   They occur on one side of the head, pulsate, and are severe enough to prevent daily activities.   They are aggravated by daily physical activities.  You may have one or more of the following symptoms:   Nausea (feeling sick to your stomach).   Vomiting.   Pain with exposure to bright lights or loud noises.  Most headache sufferers have a family history of migraines. Your headaches may also be related to alcohol and smoking habits. Too much sleep, too little sleep, mood, and anxiety may also play a part. Changing some of these triggers may help you lower the number and level of pain of the headaches. Headaches may be related to menses (female menstruation). There are numerous medications that can prevent these headaches. Your caregiver can help you with a medication or regimen (procedure to follow). If this has been a chronic (long-term) condition, the use of long-term narcotics is not recommended. Using long-term narcotics can cause recurrent migraines. Narcotics are only a temporary measure only. They are used for the infrequent migraine that fails to respond to all other measures. SEEK MEDICAL CARE IF:   You do not get relief from the medications given to you.   You have a recurrence of pain.   This headache begins to differ from past migraine (for example if it is more severe).  SEEK IMMEDIATE MEDICAL CARE IF:  You have a fever.   You have a stiff neck.   You have vision loss or have changes in vision.   You have problems with feeling lightheaded, become faint, or lose your balance.   You have muscular  weakness.   You have loss of muscular control.   You develop severe symptoms different from your first symptoms.   You start losing your balance or have trouble walking.   You feel faint or pass out.  MAKE SURE YOU:   Understand these instructions.   Will watch your condition.   Will get help right away if you are not doing well or get worse.  Document Released: 01/05/2001 Document Revised: 04/01/2011 Document Reviewed: 11/30/2007 [facility] Patient Information 2012 [place], [place].

## 2011-11-12 ENCOUNTER — Telehealth: Payer: Self-pay | Admitting: Family Medicine

## 2012-03-11 ENCOUNTER — Emergency Department (INDEPENDENT_AMBULATORY_CARE_PROVIDER_SITE_OTHER)
Admission: EM | Admit: 2012-03-11 | Discharge: 2012-03-11 | Disposition: A | Discharge status: Home / Self Care | Payer: 59 | Source: Home / Self Care

## 2012-03-11 DIAGNOSIS — R51 Headache: Secondary | ICD-10-CM

## 2012-03-11 DIAGNOSIS — G43909 Migraine, unspecified, not intractable, without status migrainosus: Secondary | ICD-10-CM

## 2012-03-11 MED ORDER — PROMETHAZINE HCL 25 MG/ML IJ SOLN
25.0000 mg | Freq: Four times a day (QID) | INTRAMUSCULAR | Status: DC | PRN
  Administered 2012-03-11 (×2): 25 mg via INTRAMUSCULAR

## 2012-03-11 MED ORDER — PROMETHAZINE HCL 25 MG/ML IJ SOLN: 25.0000 mg | Freq: Four times a day (QID) | INTRAMUSCULAR | Status: DC | PRN

## 2012-03-11 MED ORDER — RIZATRIPTAN BENZOATE 10 MG PO TABS: 10.0000 mg | ORAL_TABLET | ORAL | Status: DC | PRN

## 2012-03-11 MED ORDER — KETOROLAC TROMETHAMINE 60 MG/2ML IM SOLN
60.0000 mg | Freq: Once | INTRAMUSCULAR | Status: AC
  Administered 2012-03-11: 60 mg via INTRAMUSCULAR

## 2012-03-11 NOTE — ED Provider Notes (Signed)
History     CSN: [id number]  Arrival date & time 03/11/12  1755   First MD Initiated Contact with Patient 03/11/12 1756      Chief Complaint  Patient presents with  . Headache    HPI Comments: Pt with baseline hx/o chronic migraines.  Has been seen by PCP and headache clinic about this issue.  Currently not on prophylactic medication.  Has not tolerated many medications in the past.  Has had HA flare over past 2-3 days.  Frontal, band like distribution.  + photophobia and nausea.  Sxs similar to previous migraines in the past.     Patient is a 26 y.o. female presenting with headaches. The history is provided by the patient.  Headache The primary symptoms include headaches. The symptoms began yesterday. The symptoms are unchanged. The neurological symptoms are multifocal.    Past Medical History  Diagnosis Date  . Asthma   . Migraines     Past Surgical History  Procedure Date  . Knee surgeries     Family History  Problem Relation Age of Onset  . Migraines Other   . Bipolar disorder Mother   . Diabetes Other   . Heart failure Other   . Heart attack Other   . Diabetes Other     type 1  . Heart attack Other     History  Substance Use Topics  . Smoking status: Never Smoker   . Smokeless tobacco: Not on file  . Alcohol Use: No    OB History    Grav Para Term Preterm Abortions TAB SAB Ect Mult Living                  Review of Systems  Neurological: Positive for headaches.  All other systems reviewed and are negative.    Allergies  Codeine; Dilaudid; and Morphine and related  Home Medications   Current Outpatient Rx  Name  Route  Sig  Dispense  Refill  . ACETAMINOPHEN 325 MG PO TABS   Oral   Take 650 mg by mouth every 6 (six) hours as needed.         . ALBUTEROL SULFATE HFA 108 (90 BASE) MCG/ACT IN AERS   Inhalation   Inhale 2 puffs into the lungs every 6 (six) hours as needed.           . CYCLOBENZAPRINE HCL ER 15 MG PO CP24    Oral   Take 15 mg by mouth daily as needed.         Marland Kitchen ESOMEPRAZOLE MAGNESIUM 20 MG PO CPDR   Oral   Take 20 mg by mouth daily before breakfast.           . IBUPROFEN 200 MG PO TABS   Oral   Take 200 mg by mouth every 6 (six) hours as needed.         Suzzanne Cloud ESTRADIOL 0.25-35 MG-MCG PO TABS   Oral   Take 1 tablet by mouth daily.           Marland Kitchen RIZATRIPTAN BENZOATE 10 MG PO TABS   Oral   Take 1 tablet (10 mg total) by mouth as needed for migraine. May repeat in 2 hours if needed   10 tablet   0     BP 116/78  Pulse 107  Temp 97.8 F (36.6 C) (Oral)  Resp 20  Ht 5\' 5"  (1.651 m)  Wt 206 lb (93.441 kg)  BMI 34.28 kg/m2  SpO2 97%  Physical Exam  Constitutional: She appears well-developed and well-nourished.  HENT:  Head: Normocephalic and atraumatic.  Right Ear: External ear normal.  Left Ear: External ear normal.  Mouth/Throat: Oropharynx is clear and moist.  Eyes: Conjunctivae normal are normal. Pupils are equal, round, and reactive to light.       + photophobia with funduscopic examination bilaterally   Neck: Normal range of motion. Neck supple.  Cardiovascular: Normal rate and regular rhythm.   Pulmonary/Chest: Effort normal and breath sounds normal.  Abdominal: Soft. Bowel sounds are normal.  Musculoskeletal: Normal range of motion.  Neurological: She is alert. No cranial nerve deficit.  Skin: Skin is warm.  Psychiatric: She has a normal mood and affect.    ED Course  Procedures (including critical care time)  Labs Reviewed - No data to display No results found.   1. Migraine   2. Headache       MDM  Will treat with toradol and phenergan (IM for both).  Discussed general care and neuro red flags.  Prior imaging including MRI and MRA reviewed which were WNL. Rx for phenergan at home.  Broached issue for follow up on migraines here at [facility]. Pt is agreeable to this  Plan for follow up in 1-2 weeks vs. PCP.    The patient and/or  caregiver has been counseled thoroughly with regard to treatment plan and/or medications prescribed including dosage, schedule, interactions, rationale for use, and possible side effects and they verbalize understanding. Diagnoses and expected course of recovery discussed and will return if not improved as expected or if the condition worsens. Patient and/or caregiver verbalized understanding.               Doree Albee, MD 03/11/12 2219

## 2012-03-11 NOTE — ED Notes (Signed)
Hx of migraine...started yesterday

## 2012-03-12 ENCOUNTER — Telehealth: Payer: Self-pay | Admitting: Family Medicine

## 2012-06-29 ENCOUNTER — Emergency Department (INDEPENDENT_AMBULATORY_CARE_PROVIDER_SITE_OTHER)
Admission: EM | Admit: 2012-06-29 | Discharge: 2012-06-29 | Disposition: A | Discharge status: Home / Self Care | Payer: 59 | Source: Home / Self Care | Attending: Family Medicine | Admitting: Family Medicine

## 2012-06-29 ENCOUNTER — Encounter: Payer: Self-pay | Admitting: *Deleted

## 2012-06-29 DIAGNOSIS — G43909 Migraine, unspecified, not intractable, without status migrainosus: Secondary | ICD-10-CM

## 2012-06-29 HISTORY — DX: Gastro-esophageal reflux disease without esophagitis: K21.9

## 2012-06-29 MED ORDER — KETOROLAC TROMETHAMINE 60 MG/2ML IM SOLN
60.0000 mg | Freq: Once | INTRAMUSCULAR | Status: AC
  Administered 2012-06-29: 60 mg via INTRAMUSCULAR

## 2012-06-29 MED ORDER — PROMETHAZINE HCL 25 MG/ML IJ SOLN
25.0000 mg | Freq: Four times a day (QID) | INTRAMUSCULAR | Status: DC | PRN
  Administered 2012-06-29: 25 mg via INTRAMUSCULAR

## 2012-06-29 MED ORDER — RIZATRIPTAN BENZOATE 10 MG PO TABS: 10.0000 mg | ORAL_TABLET | ORAL | Status: DC | PRN

## 2012-06-29 MED ORDER — PROMETHAZINE HCL 25 MG PO TABS: 25.0000 mg | ORAL_TABLET | Freq: Four times a day (QID) | ORAL | Status: DC | PRN

## 2012-06-29 NOTE — ED Provider Notes (Signed)
History     CSN: [id number]  Arrival date & time 06/29/12  1519   First MD Initiated Contact with Patient 06/29/12 1535      Chief Complaint  Patient presents with  . Migraine       HPI Comments: Patient complains of onset of typical migraine headache four days ago with onset of menses.  She states that she occasionally has a headache with menses, but not predictably.  She mas minimal nausea, no vomiting.  She states that previously prescribed Maxalt was quite effective but she ran out of med. She also complains of a "crackling" sensation in her right ear when she chews, with no associated pain or discomfort.  No decrease in hearing.  No pain when chewing.  No sinus congestion.  She admits that she grinds her teeth.  Patient is a 27 y.o. female presenting with migraines. The history is provided by the patient.  Migraine This is a recurrent problem. The current episode started more than 2 days ago. The problem occurs constantly. The problem has not changed since onset.Associated symptoms comments: Mild nausea . Exacerbated by: exposure to light. Nothing relieves the symptoms. Treatments tried: Ibuprofen and SOMA. The treatment provided mild relief.    Past Medical History  Diagnosis Date  . Asthma   . Migraines   . GERD (gastroesophageal reflux disease)     Past Surgical History  Procedure Laterality Date  . Knee surgeries    . Cholecystectomy      Family History  Problem Relation Age of Onset  . Migraines Other   . Bipolar disorder Mother   . Diabetes Other   . Heart failure Other   . Heart attack Other   . Diabetes Other     type 1  . Heart attack Other     History  Substance Use Topics  . Smoking status: Never Smoker   . Smokeless tobacco: Not on file  . Alcohol Use: No    OB History   Grav Para Term Preterm Abortions TAB SAB Ect Mult Living                  Review of Systems  All other systems reviewed and are negative.    Allergies  Codeine;  Dilaudid; Morphine and related; Sudafed; and Tramadol  Home Medications   Current Outpatient Rx  Name  Route  Sig  Dispense  Refill  . acetaminophen (TYLENOL) 325 MG tablet   Oral   Take 650 mg by mouth every 6 (six) hours as needed.         Marland Kitchen albuterol (PROVENTIL HFA;VENTOLIN HFA) 108 (90 BASE) MCG/ACT inhaler   Inhalation   Inhale 2 puffs into the lungs every 6 (six) hours as needed.           . cyclobenzaprine (AMRIX) 15 MG 24 hr capsule   Oral   Take 15 mg by mouth daily as needed.         Marland Kitchen esomeprazole (NEXIUM) 20 MG capsule   Oral   Take 20 mg by mouth daily before breakfast.           . ibuprofen (ADVIL,MOTRIN) 200 MG tablet   Oral   Take 200 mg by mouth every 6 (six) hours as needed.         . norgestimate-ethinyl estradiol (ORTHO-CYCLEN,SPRINTEC,PREVIFEM) 0.25-35 MG-MCG tablet   Oral   Take 1 tablet by mouth daily.           . promethazine (PHENERGAN)  25 MG tablet   Oral   Take 1 tablet (25 mg total) by mouth every 6 (six) hours as needed for nausea.   12 tablet   0   . rizatriptan (MAXALT) 10 MG tablet   Oral   Take 1 tablet (10 mg total) by mouth as needed for migraine. May repeat in 2 hours if needed   10 tablet   1     BP 109/81  Pulse 81  Temp(Src) 98.3 F (36.8 C) (Oral)  Ht 5\' 3"  (1.6 m)  Wt 212 lb (96.163 kg)  BMI 37.56 kg/m2  SpO2 98%  LMP 06/27/2012  Physical Exam Nursing notes and Vital Signs reviewed. Appearance:  Patient appears stated age, and in no acute distress.  Patient is obese (BMI 37.6) Eyes:  Pupils are equal, round, and reactive to light and accomodation.  Extraocular movement is intact.  Conjunctivae are not inflamed.  Fundi benign.  Mild photophobia present.  Ears:  Canals normal.  Tympanic membranes normal.  No TMJ tenderness present. Nose:  Mildly congested turbinates.  No sinus tenderness.    Pharynx:  Normal Neck:  Supple.  No adenopathy.  Mild tenderness left trapezius and sternocleidomastoid muscle   Lungs:  Clear to auscultation.  Breath sounds are equal.  Heart:  Regular rate and rhythm without murmurs, rubs, or gallops.  Skin:  No rash present.  Neurologic:  Cranial nerves 2 through 12 are normal.  Patellar and elbow reflexes are normal.  Cerebellar function is intact (finger-to-nose).  Gait and station are normal.   ED Course  Procedures  none  Labs Reviewed - Tympanogram normal both ears    1. Migraine headache   2. Tinnitus of right ear; suspect TMJ dysfunction       MDM  Toradol 60mg  IM plus Phenergan 25mg  IM.  Refill given for Maxalt (use for next headache); recommend followup with PCP for headache management. Recommend ENT for evaluation of chewing-initiated tinnitus right ear.  Suspect TMJ dysfunction but there is no right side TMJ tenderness.  Patient admits that she grinds her teeth.        [name], MD 06/29/12 [phone]

## 2012-06-29 NOTE — ED Notes (Signed)
Pt c/o migraine x 4 days, LT side of neck and head. She has taken IBF and soma with no relief. She is currently out of maxalt.

## 2012-08-02 ENCOUNTER — Emergency Department
Admission: EM | Admit: 2012-08-02 | Discharge: 2012-08-02 | Disposition: A | Discharge status: Home / Self Care | Payer: 59 | Source: Home / Self Care | Attending: Family Medicine | Admitting: Family Medicine

## 2012-08-02 ENCOUNTER — Encounter: Payer: Self-pay | Admitting: *Deleted

## 2012-08-02 DIAGNOSIS — L723 Sebaceous cyst: Secondary | ICD-10-CM

## 2012-08-02 DIAGNOSIS — L03319 Cellulitis of trunk, unspecified: Secondary | ICD-10-CM

## 2012-08-02 DIAGNOSIS — L089 Local infection of the skin and subcutaneous tissue, unspecified: Secondary | ICD-10-CM

## 2012-08-02 DIAGNOSIS — L0291 Cutaneous abscess, unspecified: Secondary | ICD-10-CM

## 2012-08-02 MED ORDER — DOXYCYCLINE HYCLATE 100 MG PO CAPS: 100.0000 mg | ORAL_CAPSULE | Freq: Two times a day (BID) | ORAL | Status: AC

## 2012-08-02 NOTE — ED Notes (Signed)
Pt c/o RT upper back abscess x 9 days. Denies fever. Pt also c/o body aches.

## 2012-08-02 NOTE — ED Provider Notes (Signed)
History     CSN: [id number]  Arrival date & time 08/02/12  1924   First MD Initiated Contact with Patient 08/02/12 2001      Chief Complaint  Patient presents with  . Abscess   HPI Comments: Was seen for has approximately week and half ago. Patient was placed on Augmentin for soft tissue coverage. Incision and drainage was not done at the time. Patient states the areas progressively gotten more red and swollen. Patient states she's had some mild aches over the past one to 2 days. No fevers or chills. No headache or nuchal rigidity.  Patient is a 27 y.o. female presenting with abscess. The history is provided by the patient.  Abscess Abscess location: R upper back  Abscess quality: induration, painful and redness   Red streaking: no   Duration:  10 days Progression since onset: gradually worsening  Pain details:    Quality:  Pressure and sharp   Severity:  Moderate   Timing:  Constant   Subjective pain progression: gradually worsening  Chronicity:  New Context: not diabetes and not immunosuppression   Relieved by:  Nothing   Past Medical History  Diagnosis Date  . Asthma   . Migraines   . GERD (gastroesophageal reflux disease)     Past Surgical History  Procedure Laterality Date  . Knee surgeries    . Cholecystectomy      Family History  Problem Relation Age of Onset  . Migraines Other   . Bipolar disorder Mother   . Diabetes Other   . Heart failure Other   . Heart attack Other   . Diabetes Other     type 1  . Heart attack Other     History  Substance Use Topics  . Smoking status: Never Smoker   . Smokeless tobacco: Not on file  . Alcohol Use: No    OB History   Grav Para Term Preterm Abortions TAB SAB Ect Mult Living                  Review of Systems  All other systems reviewed and are negative.    Allergies  Codeine; Dilaudid; Morphine and related; Sudafed; and Tramadol  Home Medications   Current Outpatient Rx  Name  Route  Sig   Dispense  Refill  . acetaminophen (TYLENOL) 325 MG tablet   Oral   Take 650 mg by mouth every 6 (six) hours as needed.         Marland Kitchen albuterol (PROVENTIL HFA;VENTOLIN HFA) 108 (90 BASE) MCG/ACT inhaler   Inhalation   Inhale 2 puffs into the lungs every 6 (six) hours as needed.           . cyclobenzaprine (AMRIX) 15 MG 24 hr capsule   Oral   Take 15 mg by mouth daily as needed.         . doxycycline (VIBRAMYCIN) 100 MG capsule   Oral   Take 1 capsule (100 mg total) by mouth 2 (two) times daily.   20 capsule   0   . esomeprazole (NEXIUM) 20 MG capsule   Oral   Take 20 mg by mouth daily before breakfast.           . ibuprofen (ADVIL,MOTRIN) 200 MG tablet   Oral   Take 200 mg by mouth every 6 (six) hours as needed.         . norgestimate-ethinyl estradiol (ORTHO-CYCLEN,SPRINTEC,PREVIFEM) 0.25-35 MG-MCG tablet   Oral   Take 1 tablet  by mouth daily.           . promethazine (PHENERGAN) 25 MG tablet   Oral   Take 1 tablet (25 mg total) by mouth every 6 (six) hours as needed for nausea.   12 tablet   0   . rizatriptan (MAXALT) 10 MG tablet   Oral   Take 1 tablet (10 mg total) by mouth as needed for migraine. May repeat in 2 hours if needed   10 tablet   1     There were no vitals taken for this visit.  Physical Exam  Constitutional: She appears well-developed and well-nourished.  HENT:  Head: Normocephalic and atraumatic.  Eyes: Conjunctivae are normal. Pupils are equal, round, and reactive to light.  Neck: Normal range of motion. Neck supple.  Cardiovascular: Normal rate and regular rhythm.   Pulmonary/Chest: Effort normal.  Abdominal: Soft.  Musculoskeletal: Normal range of motion.  Neurological: She is alert.  Skin: There is erythema.       ED Course  INCISION AND DRAINAGE Date/Time: 08/02/2012 8:18 PM Performed by: Doree Albee Authorized by: Doree Albee Consent: Verbal consent obtained. Risks and benefits: risks, benefits and alternatives  were discussed Patient identity confirmed: verbally with patient Type: abscess Location: R upper back  Anesthesia: local infiltration Local anesthetic: lidocaine 2% with epinephrine Patient sedated: no Scalpel size: 11 Incision type: single with marsupialization Complexity: complex Drainage: purulent Drainage amount: copious Wound treatment: wound left open Packing material: 1/2 in iodoform gauze Patient tolerance: Patient tolerated the procedure well with no immediate complications.   (including critical care time)  Labs Reviewed - No data to display No results found.   1. Cellulitis and abscess   2. Infected sebaceous cyst       MDM  Wound Culture obtained. Will place on doxycycline for soft tissue coverage. Discontinue Augmentin. Return in 2-3 days for packing removal. Drainage consistent with sebaceous-type fluid. Patient will likely need followup with surgery at this becomes a recurrent issue. Discussed general an infectious red flags the patient at length. Followup as needed.      The patient and/or caregiver has been counseled thoroughly with regard to treatment plan and/or medications prescribed including dosage, schedule, interactions, rationale for use, and possible side effects and they verbalize understanding. Diagnoses and expected course of recovery discussed and will return if not improved as expected or if the condition worsens. Patient and/or caregiver verbalized understanding.               Doree Albee, MD 08/02/12 2023

## 2012-08-03 ENCOUNTER — Telehealth: Payer: Self-pay | Admitting: *Deleted

## 2012-08-04 ENCOUNTER — Encounter: Payer: Self-pay | Admitting: *Deleted

## 2012-08-04 ENCOUNTER — Emergency Department
Admission: EM | Admit: 2012-08-04 | Discharge: 2012-08-04 | Disposition: A | Discharge status: Home / Self Care | Payer: 59 | Source: Home / Self Care | Attending: Family Medicine | Admitting: Family Medicine

## 2012-08-04 DIAGNOSIS — Z5189 Encounter for other specified aftercare: Secondary | ICD-10-CM

## 2012-08-04 NOTE — ED Provider Notes (Signed)
History     CSN: [id number]  Arrival date & time 08/04/12  1216   None     Chief Complaint  Patient presents with  . Wound Check    Patient is a 27 y.o. female presenting with wound check. The history is provided by the patient.  Wound Check This is a new problem. Episode onset: 2-3 days ago. The problem has been gradually improving. Pertinent negatives include no chest pain, no abdominal pain, no headaches and no shortness of breath. Associated symptoms comments: No fevers, has had progressively improving pain. Controlled at home with tylenol. . Exacerbated by: direct pressure  The symptoms are relieved by NSAIDs. She has tried acetaminophen for the symptoms. Improvement on treatment: mild-moderate, pain improving   Area has been gradually improving s/p I and D. Some pain, this is also improving. No fever or chills.     Past Medical History  Diagnosis Date  . Asthma   . Migraines   . GERD (gastroesophageal reflux disease)     Past Surgical History  Procedure Laterality Date  . Knee surgeries    . Cholecystectomy      Family History  Problem Relation Age of Onset  . Migraines Other   . Bipolar disorder Mother   . Diabetes Other   . Heart failure Other   . Heart attack Other   . Diabetes Other     type 1  . Heart attack Other     History  Substance Use Topics  . Smoking status: Never Smoker   . Smokeless tobacco: Not on file  . Alcohol Use: No    OB History   Grav Para Term Preterm Abortions TAB SAB Ect Mult Living                  Review of Systems  Respiratory: Negative for shortness of breath.   Cardiovascular: Negative for chest pain.  Gastrointestinal: Negative for abdominal pain.  Neurological: Negative for headaches.  All other systems reviewed and are negative.    Allergies  Codeine; Dilaudid; Morphine and related; Sudafed; and Tramadol  Home Medications   Current Outpatient Rx  Name  Route  Sig  Dispense  Refill  . acetaminophen  (TYLENOL) 325 MG tablet   Oral   Take 650 mg by mouth every 6 (six) hours as needed.         Marland Kitchen albuterol (PROVENTIL HFA;VENTOLIN HFA) 108 (90 BASE) MCG/ACT inhaler   Inhalation   Inhale 2 puffs into the lungs every 6 (six) hours as needed.           . cyclobenzaprine (AMRIX) 15 MG 24 hr capsule   Oral   Take 15 mg by mouth daily as needed.         . doxycycline (VIBRAMYCIN) 100 MG capsule   Oral   Take 1 capsule (100 mg total) by mouth 2 (two) times daily.   20 capsule   0   . esomeprazole (NEXIUM) 20 MG capsule   Oral   Take 20 mg by mouth daily before breakfast.           . ibuprofen (ADVIL,MOTRIN) 200 MG tablet   Oral   Take 200 mg by mouth every 6 (six) hours as needed.         . norgestimate-ethinyl estradiol (ORTHO-CYCLEN,SPRINTEC,PREVIFEM) 0.25-35 MG-MCG tablet   Oral   Take 1 tablet by mouth daily.           . promethazine (PHENERGAN) 25 MG tablet  Oral   Take 1 tablet (25 mg total) by mouth every 6 (six) hours as needed for nausea.   12 tablet   0   . rizatriptan (MAXALT) 10 MG tablet   Oral   Take 1 tablet (10 mg total) by mouth as needed for migraine. May repeat in 2 hours if needed   10 tablet   1     BP 117/80  Pulse 75  Temp(Src) 98.1 F (36.7 C) (Oral)  Resp 16  SpO2 100%  Physical Exam  Constitutional: She appears well-developed and well-nourished.  HENT:  Head: Normocephalic and atraumatic.  Eyes: Conjunctivae are normal. Pupils are equal, round, and reactive to light.  Neck: Normal range of motion.  Cardiovascular: Normal rate, regular rhythm and normal heart sounds.   Pulmonary/Chest: Effort normal and breath sounds normal.  Abdominal: Soft.  Musculoskeletal: Normal range of motion.  Neurological: She is alert.  Skin:     Clinically improving R upper back abscess/infected sebaceous cyst     ED Course  Procedures (including critical care time)  Labs Reviewed - No data to display No results found.   1. Wound  check, abscess       MDM  Clinically improving right upper back abscess. Still with extensive tracking within the wound. Iodoform gauze removed at bedside. Area reanesthetized with 2% lidocaine with epinephrine given sensitivity for partial repacking with iodoform gauze. Continue oral antibiotics. Tolerating this well. Patient has followup with surgery on Monday. Discussed general an infectious red flags. Discuss with patient needed stronger medication for pain. Patient currently declines this, but will call if pain worsens. Followup as needed.     The patient and/or caregiver has been counseled thoroughly with regard to treatment plan and/or medications prescribed including dosage, schedule, interactions, rationale for use, and possible side effects and they verbalize understanding. Diagnoses and expected course of recovery discussed and will return if not improved as expected or if the condition worsens. Patient and/or caregiver verbalized understanding.    '        Doree Albee, MD 08/04/12 1321

## 2012-08-04 NOTE — ED Notes (Signed)
[name] is here for removal of her packing. Denies any chills or fever. Only c/o pain.

## 2012-08-05 LAB — WOUND CULTURE
Gram Stain: NONE SEEN
Organism ID, Bacteria: NO GROWTH

## 2012-08-06 ENCOUNTER — Telehealth: Payer: Self-pay | Admitting: Emergency Medicine

## 2012-08-07 ENCOUNTER — Encounter (INDEPENDENT_AMBULATORY_CARE_PROVIDER_SITE_OTHER): Payer: Self-pay | Admitting: General Surgery

## 2012-08-07 ENCOUNTER — Ambulatory Visit (INDEPENDENT_AMBULATORY_CARE_PROVIDER_SITE_OTHER): Payer: 59 | Admitting: General Surgery

## 2012-08-07 VITALS — BP 112/86 | HR 84 | Temp 97.8°F | Resp 16 | Ht 65.0 in | Wt 213.0 lb

## 2012-08-07 DIAGNOSIS — L723 Sebaceous cyst: Secondary | ICD-10-CM

## 2012-08-07 DIAGNOSIS — L089 Local infection of the skin and subcutaneous tissue, unspecified: Secondary | ICD-10-CM | POA: Insufficient documentation

## 2012-08-07 NOTE — Progress Notes (Signed)
Patient ID: WITNEY HUIE, female   DOB: 07/23/1985, 27 y.o.   MRN: [id number]  Chief Complaint  Patient presents with  . New Evaluation    eval seb cyst - rt shoulder blade    HPI KRYSTYNA CLECKLEY is a 27 y.o. female.   HPI Patient is a 27 year old female who presents with a sebaceous cyst over the right scapula. She stated that there was something that was around the size of a dime for 7 years. It never hurt it never bothered her for the duration of the time until a week ago. It started becoming quite red and painful. She describes the redness and area of pain around 10 cm. She saw her primary care doctor who put her on Augmentin. This did not help. She then was seen in urgent care where she had to have this incised and drained.    Past Medical History  Diagnosis Date  . Asthma   . Migraines   . GERD (gastroesophageal reflux disease)     Past Surgical History  Procedure Laterality Date  . Knee surgeries    . Cholecystectomy      Family History  Problem Relation Age of Onset  . Migraines Other   . Bipolar disorder Mother   . Diabetes Other   . Heart failure Other   . Heart attack Other   . Diabetes Other     type 1  . Heart disease Other   . Heart attack Other   . Heart disease Paternal Grandmother   . Heart disease Paternal Grandfather     Social History History  Substance Use Topics  . Smoking status: Never Smoker   . Smokeless tobacco: Not on file  . Alcohol Use: No    Allergies  Allergen Reactions  . Codeine   . Dilaudid (Hydromorphone Hcl)   . Morphine And Related   . Sudafed (Pseudoephedrine Hcl)   . Tramadol     Current Outpatient Prescriptions  Medication Sig Dispense Refill  . acetaminophen (TYLENOL) 325 MG tablet Take 650 mg by mouth every 6 (six) hours as needed.      Marland Kitchen albuterol (PROVENTIL HFA;VENTOLIN HFA) 108 (90 BASE) MCG/ACT inhaler Inhale 2 puffs into the lungs every 6 (six) hours as needed.        . cyclobenzaprine (AMRIX) 15 MG 24 hr  capsule Take 15 mg by mouth daily as needed.      . doxycycline (VIBRAMYCIN) 100 MG capsule Take 1 capsule (100 mg total) by mouth 2 (two) times daily.  20 capsule  0  . esomeprazole (NEXIUM) 20 MG capsule Take 20 mg by mouth daily before breakfast.        . ibuprofen (ADVIL,MOTRIN) 200 MG tablet Take 200 mg by mouth every 6 (six) hours as needed.      . norgestimate-ethinyl estradiol (ORTHO-CYCLEN,SPRINTEC,PREVIFEM) 0.25-35 MG-MCG tablet Take 1 tablet by mouth daily.        . promethazine (PHENERGAN) 25 MG tablet Take 1 tablet (25 mg total) by mouth every 6 (six) hours as needed for nausea.  12 tablet  0  . rizatriptan (MAXALT) 10 MG tablet Take 1 tablet (10 mg total) by mouth as needed for migraine. May repeat in 2 hours if needed  10 tablet  1   No current facility-administered medications for this visit.    Review of Systems Review of Systems  All other systems reviewed and are negative.    Blood pressure 112/86, pulse 84, temperature 97.8 F (36.6  C), temperature source Temporal, resp. rate 16, height 5\' 5"  (1.651 m), weight 213 lb (96.616 kg).  Physical Exam Physical Exam  Constitutional: She is oriented to person, place, and time. She appears well-developed and well-nourished. No distress.  HENT:  Head: Normocephalic and atraumatic.  Right Ear: External ear normal.  Left Ear: External ear normal.  Eyes: Conjunctivae are normal. Pupils are equal, round, and reactive to light. No scleral icterus.  Neck: Normal range of motion. Neck supple. No tracheal deviation present. No thyromegaly present.  Cardiovascular: Normal rate, regular rhythm and intact distal pulses.   Pulmonary/Chest: Effort normal. No respiratory distress.   She exhibits no tenderness.  Jagged opening 1 cm long, 0.5 cm wide.  Beefy red granulation tissue  Abdominal: Soft. She exhibits no distension.  Musculoskeletal: Normal range of motion.  Lymphadenopathy:    She has no cervical adenopathy.  Neurological:  She is alert and oriented to person, place, and time. Coordination normal.  Skin: Skin is warm and dry. No rash noted. She is not diaphoretic. No erythema. No pallor.  Psychiatric: She has a normal mood and affect. Her behavior is normal. Judgment and thought content normal.    Assessment      Infected sebaceous cyst Sebaceous cyst appears to be healing well. I would not further debride the wound at this time.  I repacked the wound. I advised her to remove the packing in 48 hours. At that point I would shower daily. I would redress the wound with gauze.  We'll see her back in 2 weeks. Identified the patient and these are likely to recur, but at this point now the wound is not clean and has a higher chance of infection to resect this now.      Maleah Rabago 08/07/2012, 11:19 AM

## 2012-08-07 NOTE — Assessment & Plan Note (Signed)
Sebaceous cyst appears to be healing well. I would not further debride the wound at this time.  I repacked the wound. I advised her to remove the packing in 48 hours. At that point I would shower daily. I would redress the wound with gauze.  We'll see her back in 2 weeks. Identified the patient and these are likely to recur, but at this point now the wound is not clean and has a higher chance of infection to resect this now.

## 2012-08-07 NOTE — Patient Instructions (Signed)
Remove dressing and packing in 48 hours.  Then wash wound gently daily.  Dress wound with gauze daily to protect it from lint.  Follow up in 2 weeks.

## 2012-08-21 ENCOUNTER — Encounter (INDEPENDENT_AMBULATORY_CARE_PROVIDER_SITE_OTHER): Payer: 59 | Admitting: General Surgery

## 2012-08-25 ENCOUNTER — Emergency Department
Admission: EM | Admit: 2012-08-25 | Discharge: 2012-08-25 | Disposition: A | Discharge status: Home / Self Care | Payer: 59 | Source: Home / Self Care | Attending: Family Medicine | Admitting: Family Medicine

## 2012-08-25 ENCOUNTER — Encounter: Payer: Self-pay | Admitting: Emergency Medicine

## 2012-08-25 DIAGNOSIS — G43009 Migraine without aura, not intractable, without status migrainosus: Secondary | ICD-10-CM

## 2012-08-25 MED ORDER — KETOROLAC TROMETHAMINE 60 MG/2ML IM SOLN
60.0000 mg | Freq: Once | INTRAMUSCULAR | Status: AC
  Administered 2012-08-25: 60 mg via INTRAMUSCULAR

## 2012-08-25 NOTE — ED Notes (Signed)
Migraine headache started Wednesday, she has taken Maxalt x2 and Soma x2, no releif, pain behind right eye and back of neck

## 2012-08-25 NOTE — ED Provider Notes (Signed)
History     CSN: [id number]  Arrival date & time 08/25/12  0810   None     Chief Complaint  Patient presents with  . Migraine       HPI Comments: Patient complains of onset of a typical right-sided occipital migraine headache two days ago, now involving right temporal area.  She has had no response to Maxalt (2 tabs) and SOMA.  No nausea/vomiting.  No fevers, chills, and sweats.  No history of auras with her migraines.  She notes that her present headache is associated with menses, and she gets a typical migraine approximately every 3 months with menses.  She reports adverse effect in past when she tried propranolol.  Patient is a 27 y.o. female presenting with migraines.  Migraine This is a recurrent problem. Episode onset: 2 days ago. The problem occurs hourly. The problem has been gradually worsening. Associated symptoms comments: none. Exacerbated by: light exposure. Nothing relieves the symptoms. Treatments tried: Maxalt and SOMA. The treatment provided no relief.    Past Medical History  Diagnosis Date  . Asthma   . Migraines   . GERD (gastroesophageal reflux disease)     Past Surgical History  Procedure Laterality Date  . Knee surgeries    . Cholecystectomy      Family History  Problem Relation Age of Onset  . Migraines Other   . Bipolar disorder Mother   . Diabetes Other   . Heart failure Other   . Heart attack Other   . Diabetes Other     type 1  . Heart disease Other   . Heart attack Other   . Heart disease Paternal Grandmother   . Heart disease Paternal Grandfather     History  Substance Use Topics  . Smoking status: Never Smoker   . Smokeless tobacco: Not on file  . Alcohol Use: No    OB History   Grav Para Term Preterm Abortions TAB SAB Ect Mult Living                  Review of Systems  All other systems reviewed and are negative.    Allergies  Codeine; Dilaudid; Morphine and related; Sudafed; and Tramadol  Home Medications    Current Outpatient Rx  Name  Route  Sig  Dispense  Refill  . acetaminophen (TYLENOL) 325 MG tablet   Oral   Take 650 mg by mouth every 6 (six) hours as needed.         Marland Kitchen albuterol (PROVENTIL HFA;VENTOLIN HFA) 108 (90 BASE) MCG/ACT inhaler   Inhalation   Inhale 2 puffs into the lungs every 6 (six) hours as needed.           . cyclobenzaprine (AMRIX) 15 MG 24 hr capsule   Oral   Take 15 mg by mouth daily as needed.         Marland Kitchen esomeprazole (NEXIUM) 20 MG capsule   Oral   Take 20 mg by mouth daily before breakfast.           . ibuprofen (ADVIL,MOTRIN) 200 MG tablet   Oral   Take 200 mg by mouth every 6 (six) hours as needed.         . norgestimate-ethinyl estradiol (ORTHO-CYCLEN,SPRINTEC,PREVIFEM) 0.25-35 MG-MCG tablet   Oral   Take 1 tablet by mouth daily.           . promethazine (PHENERGAN) 25 MG tablet   Oral   Take 1 tablet (25 mg total)  by mouth every 6 (six) hours as needed for nausea.   12 tablet   0   . rizatriptan (MAXALT) 10 MG tablet   Oral   Take 1 tablet (10 mg total) by mouth as needed for migraine. May repeat in 2 hours if needed   10 tablet   1     BP 115/79  Pulse 78  Temp(Src) 98.3 F (36.8 C) (Oral)  Ht 5\' 4"  (1.626 m)  Wt 214 lb (97.07 kg)  BMI 36.72 kg/m2  SpO2 98%  LMP 08/22/2012  Physical Exam Nursing notes and Vital Signs reviewed. Appearance:  Patient appears stated age, and in no acute distress.  Patient is obese (BMI 36.7) Eyes:  Pupils are equal, round, and reactive to light and accomodation.  Extraocular movement is intact.  Conjunctivae are not inflamed.  Mild photophobia present. Ears:  Canals normal.  Tympanic membranes normal.  Nose:   Normal turbinates.  No sinus tenderness.  Pharynx:  Normal Neck:  Supple.  No adenopathy Lungs:  Clear to auscultation.  Breath sounds are equal.  Heart:  Regular rate and rhythm without murmurs, rubs, or gallops.  Skin:  No rash present. Neurologic:  Cranial nerves 2 through 12  are normal.  Patellar, achilles reflexes are normal. Gait and station are normal.    ED Course  Procedures  none      1. Migraine headache without aura       MDM  Toradol 60mg  IM.  May take Phenergan if nausea develops (has Rx at home) Recommend followup with PCP; since migraine headaches occur approximately every 3 months with menses, consider trying Sarafem on a cyclic basis.        [name], MD 08/25/12 [phone]

## 2012-10-02 ENCOUNTER — Encounter: Payer: Self-pay | Admitting: *Deleted

## 2012-10-02 ENCOUNTER — Emergency Department
Admission: EM | Admit: 2012-10-02 | Discharge: 2012-10-02 | Disposition: A | Discharge status: Home / Self Care | Payer: 59 | Source: Home / Self Care

## 2012-10-02 ENCOUNTER — Other Ambulatory Visit: Payer: Self-pay | Admitting: Family Medicine

## 2012-10-02 DIAGNOSIS — J329 Chronic sinusitis, unspecified: Secondary | ICD-10-CM

## 2012-10-02 DIAGNOSIS — H6091 Unspecified otitis externa, right ear: Secondary | ICD-10-CM

## 2012-10-02 DIAGNOSIS — R062 Wheezing: Secondary | ICD-10-CM

## 2012-10-02 DIAGNOSIS — H60399 Other infective otitis externa, unspecified ear: Secondary | ICD-10-CM

## 2012-10-02 MED ORDER — AZITHROMYCIN 250 MG PO TABS: ORAL_TABLET | ORAL | Status: DC

## 2012-10-02 MED ORDER — METHYLPREDNISOLONE ACETATE 80 MG/ML IJ SUSP
80.0000 mg | Freq: Once | INTRAMUSCULAR | Status: AC
  Administered 2012-10-02: 80 mg via INTRAMUSCULAR

## 2012-10-02 MED ORDER — NEOMYCIN-POLYMYXIN-HC 3.5-10000-1 OT SOLN: 3.0000 [drp] | Freq: Four times a day (QID) | OTIC | Status: AC

## 2012-10-02 NOTE — ED Provider Notes (Signed)
History     CSN: [id number]  Arrival date & time 10/02/12  1431   None     Chief Complaint  Patient presents with  . Cough  . Headache  . Facial Pain  . Nasal Congestion   HPI  URI Symptoms Onset: 2 weeks  Description: sinus pressure, nasal congestion, headache, mild wheezing/SOB Modifying factors:  Daughter with similar sxs 2-3 weeks ago   Symptoms Nasal discharge: yes Fever: no Sore throat: no Cough: yes Wheezing: yes Ear pain: no GI symptoms: no Sick contacts: yes  Red Flags  Stiff neck: no Dyspnea: mild Rash: no Swallowing difficulty: no  Sinusitis Risk Factors Headache/face pain: yes Double sickening: yes tooth pain: no  Allergy Risk Factors Sneezing: mild Itchy scratchy throat: no Seasonal symptoms: yes  Flu Risk Factors Headache: yes muscle aches: no severe fatigue: no   Past Medical History  Diagnosis Date  . Asthma   . Migraines   . GERD (gastroesophageal reflux disease)     Past Surgical History  Procedure Laterality Date  . Knee surgeries    . Cholecystectomy      Family History  Problem Relation Age of Onset  . Migraines Other   . Bipolar disorder Mother   . Diabetes Other   . Heart failure Other   . Heart attack Other   . Diabetes Other     type 1  . Heart disease Other   . Heart attack Other   . Heart disease Paternal Grandmother   . Heart disease Paternal Grandfather     History  Substance Use Topics  . Smoking status: Never Smoker   . Smokeless tobacco: Not on file  . Alcohol Use: No    OB History   Grav Para Term Preterm Abortions TAB SAB Ect Mult Living                  Review of Systems  All other systems reviewed and are negative.    Allergies  Codeine; Dilaudid; Morphine and related; Sudafed; and Tramadol  Home Medications   Current Outpatient Rx  Name  Route  Sig  Dispense  Refill  . acetaminophen (TYLENOL) 325 MG tablet   Oral   Take 650 mg by mouth every 6 (six) hours as needed.          Marland Kitchen albuterol (PROVENTIL HFA;VENTOLIN HFA) 108 (90 BASE) MCG/ACT inhaler   Inhalation   Inhale 2 puffs into the lungs every 6 (six) hours as needed.           Marland Kitchen azithromycin (ZITHROMAX) 250 MG tablet      Take 2 tabs PO x 1 dose, then 1 tab PO QD x 4 days   6 tablet   0   . cyclobenzaprine (AMRIX) 15 MG 24 hr capsule   Oral   Take 15 mg by mouth daily as needed.         Marland Kitchen esomeprazole (NEXIUM) 20 MG capsule   Oral   Take 20 mg by mouth daily before breakfast.           . ibuprofen (ADVIL,MOTRIN) 200 MG tablet   Oral   Take 200 mg by mouth every 6 (six) hours as needed.         . neomycin-polymyxin-hydrocortisone (CORTISPORIN) otic solution   Right Ear   Place 3 drops into the right ear 4 (four) times daily.   10 mL   0   . norgestimate-ethinyl estradiol (ORTHO-CYCLEN,SPRINTEC,PREVIFEM) 0.25-35 MG-MCG tablet  Oral   Take 1 tablet by mouth daily.           . promethazine (PHENERGAN) 25 MG tablet   Oral   Take 1 tablet (25 mg total) by mouth every 6 (six) hours as needed for nausea.   12 tablet   0   . rizatriptan (MAXALT) 10 MG tablet   Oral   Take 1 tablet (10 mg total) by mouth as needed for migraine. May repeat in 2 hours if needed   10 tablet   1     BP 131/91  Pulse 97  Temp(Src) 98.1 F (36.7 C) (Oral)  Resp 18  Wt 209 lb (94.802 kg)  BMI 35.86 kg/m2  SpO2 98%  Physical Exam  Constitutional: She appears well-developed and well-nourished.  HENT:  Head: Normocephalic and atraumatic.  R ear canal erythema and tenderness to otoscopic evaluation Mild R TM bulging + bilateral maxillary TTP  +nasal erythema, rhinorrhea bilaterally, + post oropharyngeal erythema    Eyes: Conjunctivae are normal. Pupils are equal, round, and reactive to light.  Neck: Normal range of motion.  Cardiovascular: Normal rate, regular rhythm and normal heart sounds.   Pulmonary/Chest: Effort normal. She has wheezes.  Abdominal: Soft.  Musculoskeletal: Normal  range of motion.  Neurological: She is alert.  Skin: Skin is warm.    ED Course  Procedures (including critical care time)  Labs Reviewed - No data to display No results found.   1. Sinusitis   2. AOM (acute otitis media), right   3. OE (otitis externa), right   4. Wheezing       MDM  Depomedrol 80mg  IM x1 for wheezing  Zpak for lower and upper resp coverage Cortisporin for OE Discussed infectious and resp red flags.  Follow up as needed.      The patient and/or caregiver has been counseled thoroughly with regard to treatment plan and/or medications prescribed including dosage, schedule, interactions, rationale for use, and possible side effects and they verbalize understanding. Diagnoses and expected course of recovery discussed and will return if not improved as expected or if the condition worsens. Patient and/or caregiver verbalized understanding.             Doree Albee, MD 10/02/12 [phone]

## 2012-10-02 NOTE — ED Notes (Signed)
Pt c/o cough, HA, nasal congestion, and sinus pain x 2 wks, worse x today. Denies fever.

## 2012-10-03 ENCOUNTER — Emergency Department (INDEPENDENT_AMBULATORY_CARE_PROVIDER_SITE_OTHER)
Admission: EM | Admit: 2012-10-03 | Discharge: 2012-10-03 | Disposition: A | Discharge status: Home / Self Care | Payer: 59 | Source: Home / Self Care | Attending: Family Medicine | Admitting: Family Medicine

## 2012-10-03 ENCOUNTER — Emergency Department (INDEPENDENT_AMBULATORY_CARE_PROVIDER_SITE_OTHER): Payer: 59

## 2012-10-03 ENCOUNTER — Encounter: Payer: Self-pay | Admitting: *Deleted

## 2012-10-03 DIAGNOSIS — R51 Headache: Secondary | ICD-10-CM

## 2012-10-03 DIAGNOSIS — J3489 Other specified disorders of nose and nasal sinuses: Secondary | ICD-10-CM

## 2012-10-03 DIAGNOSIS — R110 Nausea: Secondary | ICD-10-CM

## 2012-10-03 MED ORDER — KETOROLAC TROMETHAMINE 60 MG/2ML IM SOLN
60.0000 mg | Freq: Once | INTRAMUSCULAR | Status: AC
  Administered 2012-10-03: 60 mg via INTRAMUSCULAR

## 2012-10-03 MED ORDER — PREDNISONE 20 MG PO TABS: 20.0000 mg | ORAL_TABLET | Freq: Two times a day (BID) | ORAL | Status: DC

## 2012-10-03 MED ORDER — ONDANSETRON 4 MG PO TBDP
4.0000 mg | ORAL_TABLET | Freq: Once | ORAL | Status: AC
  Administered 2012-10-03: 4 mg via ORAL

## 2012-10-03 MED ORDER — ONDANSETRON HCL 4 MG PO TABS: 4.0000 mg | ORAL_TABLET | Freq: Four times a day (QID) | ORAL | Status: DC

## 2012-10-03 NOTE — ED Provider Notes (Signed)
History     CSN: [id number]  Arrival date & time 10/03/12  1632   First MD Initiated Contact with Patient 10/03/12 1654      Chief Complaint  Patient presents with  . Headache       HPI Comments: Patient was treated yesterday for sinusitis.  She complains of persistent nausea (without vomiting), frontal headache and sinus pressure.  No neurologic symptoms otherwise.  Patient is a 27 y.o. female presenting with migraines. The history is provided by the patient.  Migraine This is a recurrent problem. The current episode started more than 2 days ago. The problem occurs constantly. The problem has not changed since onset.Associated symptoms comments: Nausea . Nothing relieves the symptoms.    Past Medical History  Diagnosis Date  . Asthma   . Migraines   . GERD (gastroesophageal reflux disease)     Past Surgical History  Procedure Laterality Date  . Knee surgeries    . Cholecystectomy      Family History  Problem Relation Age of Onset  . Migraines Other   . Bipolar disorder Mother   . Diabetes Other   . Heart failure Other   . Heart attack Other   . Diabetes Other     type 1  . Heart disease Other   . Heart attack Other   . Heart disease Paternal Grandmother   . Heart disease Paternal Grandfather     History  Substance Use Topics  . Smoking status: Never Smoker   . Smokeless tobacco: Not on file  . Alcohol Use: No    OB History   Grav Para Term Preterm Abortions TAB SAB Ect Mult Living                  Review of Systems  All other systems reviewed and are negative.    Allergies  Codeine; Dilaudid; Morphine and related; Sudafed; and Tramadol  Home Medications   Current Outpatient Rx  Name  Route  Sig  Dispense  Refill  . acetaminophen (TYLENOL) 325 MG tablet   Oral   Take 650 mg by mouth every 6 (six) hours as needed.         Marland Kitchen albuterol (PROVENTIL HFA;VENTOLIN HFA) 108 (90 BASE) MCG/ACT inhaler   Inhalation   Inhale 2 puffs into the lungs  every 6 (six) hours as needed.           Marland Kitchen azithromycin (ZITHROMAX) 250 MG tablet      Take 2 tabs PO x 1 dose, then 1 tab PO QD x 4 days   6 tablet   0   . cyclobenzaprine (AMRIX) 15 MG 24 hr capsule   Oral   Take 15 mg by mouth daily as needed.         Marland Kitchen esomeprazole (NEXIUM) 20 MG capsule   Oral   Take 20 mg by mouth daily before breakfast.           . ibuprofen (ADVIL,MOTRIN) 200 MG tablet   Oral   Take 200 mg by mouth every 6 (six) hours as needed.         . neomycin-polymyxin-hydrocortisone (CORTISPORIN) otic solution   Right Ear   Place 3 drops into the right ear 4 (four) times daily.   10 mL   0   . norgestimate-ethinyl estradiol (ORTHO-CYCLEN,SPRINTEC,PREVIFEM) 0.25-35 MG-MCG tablet   Oral   Take 1 tablet by mouth daily.           . ondansetron (ZOFRAN)  4 MG tablet   Oral   Take 1 tablet (4 mg total) by mouth every 6 (six) hours. as needed for nausea   12 tablet   0   . predniSONE (DELTASONE) 20 MG tablet   Oral   Take 1 tablet (20 mg total) by mouth 2 (two) times daily. Take with food. Begin Wednesday 10/04/12   10 tablet   0   . promethazine (PHENERGAN) 25 MG tablet   Oral   Take 1 tablet (25 mg total) by mouth every 6 (six) hours as needed for nausea.   12 tablet   0   . rizatriptan (MAXALT) 10 MG tablet   Oral   Take 1 tablet (10 mg total) by mouth as needed for migraine. May repeat in 2 hours if needed   10 tablet   1     BP 107/73  Pulse 70  Temp(Src) 98 F (36.7 C) (Oral)  Resp 16  Ht 5\' 5"  (1.651 m)  Wt 209 lb (94.802 kg)  BMI 34.78 kg/m2  SpO2 98%  LMP 09/27/2012  Physical Exam Nursing notes and Vital Signs reviewed. Appearance:  Patient appears stated age, and in no acute distress.  Patient is obese (BMI 34.8) Eyes:  Pupils are equal, round, and reactive to light and accomodation.  Extraocular movement is intact.  Conjunctivae are not inflamed.  Fundi benign.  No photophobia  Nose:  Congested turbinates.  Maxillary  sinus tenderness is present.  Pharynx:  Normal Neck:  Supple.  No adenopathy Neurologic:  Cranial nerves 2 through 12 are normal.    Skin:  No rash present.   ED Course  Procedures  none   Dg Sinuses Complete  10/03/2012   *RADIOLOGY REPORT*  Clinical Data: Sinus congestion.  Facial pain.  PARANASAL SINUSES - COMPLETE 3 + VIEW  Comparison: None.  Findings: The paranasal sinuses appear clear.  No specific findings of sinusitis.  IMPRESSION:  1. No sinusitis identified.   Original Report Authenticated By: Gaylyn Rong, M.D.     1. Headache(784.0); suspect migraine   2. Nausea alone       MDM  Toradol 60mg  IM and Zofran 4mg  ODT Tomorrow begin prednisone burst. Take Mucinex D (guaifenesin with decongestant) twice daily for congestion.  Increase fluid intake, rest. May use Afrin nasal spray (or generic oxymetazoline) twice daily for about 5 days.  Also recommend using saline nasal spray several times daily and saline nasal irrigation (AYR is a common brand) Followup with Family Doctor if not improved in one week.            [name], MD 10/03/12 [phone]

## 2012-10-03 NOTE — ED Notes (Signed)
Patient c/o headache x 4 days. States she came in yesterday and is doing worse today with the pain.

## 2012-11-02 ENCOUNTER — Encounter: Payer: Self-pay | Admitting: *Deleted

## 2012-11-02 ENCOUNTER — Emergency Department
Admission: EM | Admit: 2012-11-02 | Discharge: 2012-11-02 | Disposition: A | Discharge status: Home / Self Care | Payer: 59 | Source: Home / Self Care | Attending: Family Medicine | Admitting: Family Medicine

## 2012-11-02 DIAGNOSIS — G43909 Migraine, unspecified, not intractable, without status migrainosus: Secondary | ICD-10-CM

## 2012-11-02 MED ORDER — KETOROLAC TROMETHAMINE 60 MG/2ML IM SOLN
60.0000 mg | Freq: Once | INTRAMUSCULAR | Status: AC
  Administered 2012-11-02: 60 mg via INTRAMUSCULAR

## 2012-11-02 MED ORDER — RIZATRIPTAN BENZOATE 10 MG PO TABS: 10.0000 mg | ORAL_TABLET | ORAL | Status: DC | PRN

## 2012-11-02 NOTE — ED Provider Notes (Signed)
History    CSN: [id number] Arrival date & time 11/02/12  1749  First MD Initiated Contact with Patient 11/02/12 1804     Chief Complaint  Patient presents with  . Migraine      HPI Comments: Patient complains of a persistent typical left-sided headache for 10 days that has not responded to Maxalt.  She reports that she is under increased stress with her job. She states that she has an appt with a headache specialist in about two weeks.  Patient is a 27 y.o. female presenting with migraines. The history is provided by the patient.  Migraine This is a recurrent problem. The current episode started more than 1 week ago. The problem occurs constantly. The problem has not changed since onset.Associated symptoms comments: none. The symptoms are aggravated by stress. Nothing relieves the symptoms. Treatments tried: Maxalt. The treatment provided mild relief.   Past Medical History  Diagnosis Date  . Asthma   . Migraines   . GERD (gastroesophageal reflux disease)    Past Surgical History  Procedure Laterality Date  . Knee surgeries    . Cholecystectomy     Family History  Problem Relation Age of Onset  . Migraines Other   . Bipolar disorder Mother   . Diabetes Other   . Heart failure Other   . Heart attack Other   . Diabetes Other     type 1  . Heart disease Other   . Heart attack Other   . Heart disease Paternal Grandmother   . Heart disease Paternal Grandfather    History  Substance Use Topics  . Smoking status: Never Smoker   . Smokeless tobacco: Not on file  . Alcohol Use: No   OB History   Grav Para Term Preterm Abortions TAB SAB Ect Mult Living                 Review of Systems  All other systems reviewed and are negative.    Allergies  Codeine; Dilaudid; Morphine and related; Sudafed; and Tramadol  Home Medications   Current Outpatient Rx  Name  Route  Sig  Dispense  Refill  . carisoprodol (SOMA) 350 MG tablet   Oral   Take 350 mg by mouth 4 (four)  times daily as needed for muscle spasms.         Marland Kitchen acetaminophen (TYLENOL) 325 MG tablet   Oral   Take 650 mg by mouth every 6 (six) hours as needed.         Marland Kitchen albuterol (PROVENTIL HFA;VENTOLIN HFA) 108 (90 BASE) MCG/ACT inhaler   Inhalation   Inhale 2 puffs into the lungs every 6 (six) hours as needed.           Marland Kitchen azithromycin (ZITHROMAX) 250 MG tablet      Take 2 tabs PO x 1 dose, then 1 tab PO QD x 4 days   6 tablet   0   . cyclobenzaprine (AMRIX) 15 MG 24 hr capsule   Oral   Take 15 mg by mouth daily as needed.         Marland Kitchen esomeprazole (NEXIUM) 20 MG capsule   Oral   Take 20 mg by mouth daily before breakfast.           . ibuprofen (ADVIL,MOTRIN) 200 MG tablet   Oral   Take 200 mg by mouth every 6 (six) hours as needed.         . norgestimate-ethinyl estradiol (ORTHO-CYCLEN,SPRINTEC,PREVIFEM) 0.25-35 MG-MCG  tablet   Oral   Take 1 tablet by mouth daily.           . ondansetron (ZOFRAN) 4 MG tablet   Oral   Take 1 tablet (4 mg total) by mouth every 6 (six) hours. as needed for nausea   12 tablet   0   . predniSONE (DELTASONE) 20 MG tablet   Oral   Take 1 tablet (20 mg total) by mouth 2 (two) times daily. Take with food. Begin Wednesday 10/04/12   10 tablet   0   . promethazine (PHENERGAN) 25 MG tablet   Oral   Take 1 tablet (25 mg total) by mouth every 6 (six) hours as needed for nausea.   12 tablet   0   . rizatriptan (MAXALT) 10 MG tablet   Oral   Take 1 tablet (10 mg total) by mouth as needed for migraine. May repeat in 2 hours if needed   10 tablet   1    BP 109/75  Pulse 76  Temp(Src) 98 F (36.7 C) (Oral)  Resp 18  Ht 5\' 5"  (1.651 m)  Wt 205 lb (92.987 kg)  BMI 34.11 kg/m2  SpO2 98%  LMP 10/27/2012 Physical Exam Nursing notes and Vital Signs reviewed. Appearance:  Patient appears healthy, stated age, and in no acute distress.  She is alert and oriented.  Eyes:  Pupils are equal, round, and reactive to light and accomodation.   Extraocular movement is intact.  Conjunctivae are not inflamed  Pharynx:  Normal Neck:  Supple.   Neurologic:  Cranial nerves 2 through 12 are normal.       ED Course  Procedures  none   1. Migraine headache     MDM  Toradol 60mg  IM.  Refill Maxalt Followup headache specialist as planned.  [name], MD 11/02/12 [phone]

## 2012-11-02 NOTE — ED Notes (Signed)
Pt c/o migraine x 10 days. She does report increased stress.

## 2012-12-26 ENCOUNTER — Institutional Professional Consult (permissible substitution): Payer: 59 | Admitting: Nurse Practitioner

## 2013-01-02 ENCOUNTER — Ambulatory Visit (INDEPENDENT_AMBULATORY_CARE_PROVIDER_SITE_OTHER): Payer: 59 | Admitting: Nurse Practitioner

## 2013-01-02 ENCOUNTER — Encounter: Payer: Self-pay | Admitting: Nurse Practitioner

## 2013-01-02 VITALS — BP 104/79 | HR 72 | Ht 64.5 in | Wt 207.0 lb

## 2013-01-02 DIAGNOSIS — G43009 Migraine without aura, not intractable, without status migrainosus: Secondary | ICD-10-CM

## 2013-01-02 MED ORDER — NAPROXEN SODIUM 550 MG PO TABS: 550.0000 mg | ORAL_TABLET | Freq: Two times a day (BID) | ORAL | Status: DC

## 2013-01-02 MED ORDER — ZOLPIDEM TARTRATE 10 MG PO TABS: 10.0000 mg | ORAL_TABLET | Freq: Every evening | ORAL | Status: DC | PRN

## 2013-01-02 MED ORDER — RIZATRIPTAN BENZOATE 10 MG PO TABS: 10.0000 mg | ORAL_TABLET | ORAL | Status: DC | PRN

## 2013-01-02 MED ORDER — VENLAFAXINE HCL ER 37.5 MG PO CP24: 37.5000 mg | ORAL_CAPSULE | Freq: Three times a day (TID) | ORAL | Status: DC

## 2013-01-02 MED ORDER — TRAMADOL HCL 50 MG PO TABS
50.0000 mg | ORAL_TABLET | Freq: Four times a day (QID) | ORAL | Status: DC | PRN
Start: 2013-01-02 — End: 2013-05-01

## 2013-01-02 NOTE — Patient Instructions (Signed)
Migraine Headache A migraine headache is an intense, throbbing pain on one or both sides of your head. A migraine can last for 30 minutes to several hours. CAUSES  The exact cause of a migraine headache is not always known. However, a migraine may be caused when nerves in the brain become irritated and release chemicals that cause inflammation. This causes pain. SYMPTOMS  Pain on one or both sides of your head.  Pulsating or throbbing pain.  Severe pain that prevents daily activities.  Pain that is aggravated by any physical activity.  Nausea, vomiting, or both.  Dizziness.  Pain with exposure to bright lights, loud noises, or activity.  General sensitivity to bright lights, loud noises, or smells. Before you get a migraine, you may get warning signs that a migraine is coming (aura). An aura may include:  Seeing flashing lights.  Seeing bright spots, halos, or zig-zag lines.  Having tunnel vision or blurred vision.  Having feelings of numbness or tingling.  Having trouble talking.  Having muscle weakness. MIGRAINE TRIGGERS  Alcohol.  Smoking.  Stress.  Menstruation.  Aged cheeses.  Foods or drinks that contain nitrates, glutamate, aspartame, or tyramine.  Lack of sleep.  Chocolate.  Caffeine.  Hunger.  Physical exertion.  Fatigue.  Medicines used to treat chest pain (nitroglycerine), birth control pills, estrogen, and some blood pressure medicines. DIAGNOSIS  A migraine headache is often diagnosed based on:  Symptoms.  Physical examination.  A CT scan or MRI of your head. TREATMENT Medicines may be given for pain and nausea. Medicines can also be given to help prevent recurrent migraines.  HOME CARE INSTRUCTIONS  Only take over-the-counter or prescription medicines for pain or discomfort as directed by your caregiver. The use of long-term narcotics is not recommended.  Lie down in a dark, quiet room when you have a migraine.  Keep a journal  to find out what may trigger your migraine headaches. For example, write down:  What you eat and drink.  How much sleep you get.  Any change to your diet or medicines.  Limit alcohol consumption.  Quit smoking if you smoke.  Get 7 to 9 hours of sleep, or as recommended by your caregiver.  Limit stress.  Keep lights dim if bright lights bother you and make your migraines worse. SEEK IMMEDIATE MEDICAL CARE IF:   Your migraine becomes severe.  You have a fever.  You have a stiff neck.  You have vision loss.  You have muscular weakness or loss of muscle control.  You start losing your balance or have trouble walking.  You feel faint or pass out.  You have severe symptoms that are different from your first symptoms. MAKE SURE YOU:   Understand these instructions.  Will watch your condition.  Will get help right away if you are not doing well or get worse. Document Released: 04/12/2005 Document Revised: 07/05/2011 Document Reviewed: 04/02/2011 [employer] Patient Information 2014 [employer].  

## 2013-01-02 NOTE — Progress Notes (Signed)
Diagnosis: Migraine without Aura  History: [name] 27 [id number] comes to [place] office today to discuss her migraine headaches. She has had migraine since she was 27 years old. Her mother is bipolar and aunt has migraine. [facility] and had negative MRI/MRA. Her migraines have gotten worse since the birth of her son [name]. He is not a good sleeper or eater. They rarely get a good nights sleep, often up 2-3 times per night. She has had difficulty with medications in the past and prefers to take as little as possible.      Location: [facility] [place] [facility] [place]  Number of Headache days/month: migraines last for more than 4 hours, can last 2-3 days Severe: 2 Moderate:8 Mild:20  Current Outpatient Prescriptions on File Prior to Visit  Medication Sig Dispense Refill  . acetaminophen (TYLENOL) 325 MG tablet Take 650 mg by mouth every 6 (six) hours as needed.      Marland Kitchen albuterol (PROVENTIL HFA;VENTOLIN HFA) 108 (90 BASE) MCG/ACT inhaler Inhale 2 puffs into the lungs every 6 (six) hours as needed.        . carisoprodol (SOMA) 350 MG tablet Take 350 mg by mouth 4 (four) times daily as needed for muscle spasms.      Marland Kitchen esomeprazole (NEXIUM) 20 MG capsule Take 20 mg by mouth daily before breakfast.        . ibuprofen (ADVIL,MOTRIN) 200 MG tablet Take 200 mg by mouth every 6 (six) hours as needed.      . ondansetron (ZOFRAN) 4 MG tablet Take 1 tablet (4 mg total) by mouth every 6 (six) hours. as needed for nausea  12 tablet  0  . promethazine (PHENERGAN) 25 MG tablet Take 1 tablet (25 mg total) by mouth every 6 (six) hours as needed for nausea.  12 tablet  0  . rizatriptan (MAXALT) 10 MG tablet Take 1 tablet (10 mg total) by mouth as needed for migraine. May repeat in 2 hours if needed  10 tablet  1   No current facility-administered medications on file prior to visit.    Acute or prevention: Has been on Topamax, Zonegran, Inderal, toradol,  maxalt, OTC meds, trigger point injections,   Past Medical History  Diagnosis Date  . Asthma   . Migraines   . GERD (gastroesophageal reflux disease)    Past Surgical History  Procedure Laterality Date  . Knee surgeries    . Cholecystectomy     Family History  Problem Relation Age of Onset  . Migraines Other   . Bipolar disorder Mother   . Diabetes Other   . Heart failure Other   . Heart attack Other   . Diabetes Other     type 1  . Heart disease Other   . Heart attack Other   . Heart disease Paternal Grandmother   . Heart disease Paternal Grandfather    Social History:  reports that she has never smoked. She does not have any smokeless tobacco history on file. She reports that she does not drink alcohol or use illicit drugs. Socially she is married and works full time with 2 children including one that is difficult. He is 29 YO [place] and rarely sleeps through the night. He has difficulty with eating and sleeping issues. As a couple they are stressed with recent bankruptcy  following her lay off from [employer]. They are in a small house with 2 dogs.  Allergies:  Allergies  Allergen Reactions  . Codeine   . Dilaudid [Hydromorphone Hcl]   . Morphine And Related   . Sudafed [Pseudoephedrine Hcl]     Triggers: Stress, lack of sleep, weather changes  Birth control: none  ROS: Positive for migraines, insomnia, anxiety, asthma with illness, reflux,   Exam:Well developed, well nourished, NAD  General: NAD HEENT: Negative Cardiac: RRR Lungs:Claer Neuro:Negative Skin: warm and dry  Impression:migraine - common, Chronic for last 3 years  Plan: Discussed the pathophysiology of migraine and medication management. Will start with small dose of Effexor and trial going up on dose. Will seek approval for Botox. Encouraged to get on birth control asap and discussed Nexplanon as she does not do well with BCPs. Ambien for sleep that is encouraged every other night. Will add  Ananprox and Ultram as needed for rescue. She and her husband are encouraged to seek family counseling to help them deal with a difficult child.    Time Spent:One hour

## 2013-01-02 NOTE — Addendum Note (Signed)
Addended by: Delbert Phenix on: 01/02/2013 01:50 PM   Modules accepted: Orders

## 2013-01-09 ENCOUNTER — Ambulatory Visit (INDEPENDENT_AMBULATORY_CARE_PROVIDER_SITE_OTHER): Payer: 59 | Admitting: Obstetrics & Gynecology

## 2013-01-09 ENCOUNTER — Encounter: Payer: Self-pay | Admitting: Obstetrics & Gynecology

## 2013-01-09 VITALS — BP 116/73 | HR 85 | Resp 16 | Ht 64.5 in | Wt 207.0 lb

## 2013-01-09 DIAGNOSIS — IMO0001 Reserved for inherently not codable concepts without codable children: Secondary | ICD-10-CM

## 2013-01-09 DIAGNOSIS — Z01812 Encounter for preprocedural laboratory examination: Secondary | ICD-10-CM

## 2013-01-09 DIAGNOSIS — Z30017 Encounter for initial prescription of implantable subdermal contraceptive: Secondary | ICD-10-CM

## 2013-01-09 LAB — POCT URINE PREGNANCY: Preg Test, Ur: NEGATIVE

## 2013-01-09 MED ORDER — ETONOGESTREL 68 MG ~~LOC~~ IMPL
68.0000 mg | DRUG_IMPLANT | Freq: Once | SUBCUTANEOUS | Status: AC
  Administered 2013-01-09: 68 mg via SUBCUTANEOUS

## 2013-01-09 NOTE — Patient Instructions (Signed)
Contraception Choices  Contraception (birth control) is the use of any methods or devices to prevent pregnancy. Below are some methods to help avoid pregnancy.  HORMONAL METHODS   · Contraceptive implant. This is a thin, plastic tube containing progesterone hormone. It does not contain estrogen hormone. Your caregiver inserts the tube in the inner part of the upper arm. The tube can remain in place for up to 3 years. After 3 years, the implant must be removed. The implant prevents the ovaries from releasing an egg (ovulation), thickens the cervical mucus which prevents sperm from entering the uterus, and thins the lining of the inside of the uterus.  · Progesterone-only injections. These injections are given every 3 months by your caregiver to prevent pregnancy. This synthetic progesterone hormone stops the ovaries from releasing eggs. It also thickens cervical mucus and changes the uterine lining. This makes it harder for sperm to survive in the uterus.  · Birth control pills. These pills contain estrogen and progesterone hormone. They work by stopping the egg from forming in the ovary (ovulation). Birth control pills are prescribed by a caregiver. Birth control pills can also be used to treat heavy periods.  · Minipill. This type of birth control pill contains only the progesterone hormone. They are taken every day of each month and must be prescribed by your caregiver.  · Birth control patch. The patch contains hormones similar to those in birth control pills. It must be changed once a week and is prescribed by a caregiver.  · Vaginal ring. The ring contains hormones similar to those in birth control pills. It is left in the vagina for 3 weeks, removed for 1 week, and then a new one is put back in place. The patient must be comfortable inserting and removing the ring from the vagina. A caregiver's prescription is necessary.  · Emergency contraception. Emergency contraceptives prevent pregnancy after unprotected  sexual intercourse. This pill can be taken right after sex or up to 5 days after unprotected sex. It is most effective the sooner you take the pills after having sexual intercourse. Emergency contraceptive pills are available without a prescription. Check with your pharmacist. Do not use emergency contraception as your only form of birth control.  BARRIER METHODS   · Female condom. This is a thin sheath (latex or rubber) that is worn over the penis during sexual intercourse. It can be used with spermicide to increase effectiveness.  · Female condom. This is a soft, loose-fitting sheath that is put into the vagina before sexual intercourse.  · Diaphragm. This is a soft, latex, dome-shaped barrier that must be fitted by a caregiver. It is inserted into the vagina, along with a spermicidal jelly. It is inserted before intercourse. The diaphragm should be left in the vagina for 6 to 8 hours after intercourse.  · Cervical cap. This is a round, soft, latex or plastic cup that fits over the cervix and must be fitted by a caregiver. The cap can be left in place for up to 48 hours after intercourse.  · Sponge. This is a soft, circular piece of polyurethane foam. The sponge has spermicide in it. It is inserted into the vagina after wetting it and before sexual intercourse.  · Spermicides. These are chemicals that kill or block sperm from entering the cervix and uterus. They come in the form of creams, jellies, suppositories, foam, or tablets. They do not require a prescription. They are inserted into the vagina with an applicator before having sexual intercourse.   The process must be repeated every time you have sexual intercourse.  INTRAUTERINE CONTRACEPTION  · Intrauterine device (IUD). This is a T-shaped device that is put in a woman's uterus during a menstrual period to prevent pregnancy. There are 2 types:  · Copper IUD. This type of IUD is wrapped in copper wire and is placed inside the uterus. Copper makes the uterus and  fallopian tubes produce a fluid that kills sperm. It can stay in place for 10 years.  · Hormone IUD. This type of IUD contains the hormone progestin (synthetic progesterone). The hormone thickens the cervical mucus and prevents sperm from entering the uterus, and it also thins the uterine lining to prevent implantation of a fertilized egg. The hormone can weaken or kill the sperm that get into the uterus. It can stay in place for 5 years.  PERMANENT METHODS OF CONTRACEPTION  · Female tubal ligation. This is when the woman's fallopian tubes are surgically sealed, tied, or blocked to prevent the egg from traveling to the uterus.  · Female sterilization. This is when the female has the tubes that carry sperm tied off (vasectomy). This blocks sperm from entering the vagina during sexual intercourse. After the procedure, the man can still ejaculate fluid (semen).  NATURAL PLANNING METHODS  · Natural family planning. This is not having sexual intercourse or using a barrier method (condom, diaphragm, cervical cap) on days the woman could become pregnant.  · Calendar method. This is keeping track of the length of each menstrual cycle and identifying when you are fertile.  · Ovulation method. This is avoiding sexual intercourse during ovulation.  · Symptothermal method. This is avoiding sexual intercourse during ovulation, using a thermometer and ovulation symptoms.  · Post-ovulation method. This is timing sexual intercourse after you have ovulated.  Regardless of which type or method of contraception you choose, it is important that you use condoms to protect against the transmission of sexually transmitted diseases (STDs). Talk with your caregiver about which form of contraception is most appropriate for you.  Document Released: 04/12/2005 Document Revised: 07/05/2011 Document Reviewed: 08/19/2010  [employer]® Patient Information ©2014 [employer].

## 2013-01-09 NOTE — Progress Notes (Signed)
  Subjective:    Patient ID: [name], female    DOB: [birth year], 27 y.o.   MRN: [id number]  HPI 27 yo lady with migraines who is here today for Nexplanon insertion. She is due for her annual and plans to come back in a few months. She currently uses withdrawal for birthcontrol as she reports that she gets yeast infections from condoms. She doesn't like OCPs. She understands that irregular bleeding is a known side effect of Nexplanon.   Review of Systems  She had her last pap with Dr. Waynard Reeds.    Objective:   Physical Exam  UPT was negative. Consent was signed. Time out procedure was done. Her left arm was prepped with betadine and infiltrated with 3 cc of 1% lidocaine. After adequate anesthesia was assured, the Nexplanon device was placed according to standard of care. Her arm was hemostatic and was bandaged. She tolerated the procedure well.      Assessment & Plan:   Contraception- Nexplanon Preventative care- RTC 2 months for annual exam

## 2013-02-15 ENCOUNTER — Encounter: Payer: Self-pay | Admitting: Obstetrics & Gynecology

## 2013-02-15 ENCOUNTER — Ambulatory Visit (INDEPENDENT_AMBULATORY_CARE_PROVIDER_SITE_OTHER): Payer: 59 | Admitting: Obstetrics & Gynecology

## 2013-02-15 VITALS — BP 122/79 | HR 91 | Resp 16 | Ht 64.0 in | Wt 202.0 lb

## 2013-02-15 DIAGNOSIS — R102 Pelvic and perineal pain: Secondary | ICD-10-CM

## 2013-02-15 DIAGNOSIS — N949 Unspecified condition associated with female genital organs and menstrual cycle: Secondary | ICD-10-CM

## 2013-02-15 DIAGNOSIS — N907 Vulvar cyst: Secondary | ICD-10-CM

## 2013-02-15 DIAGNOSIS — N9089 Other specified noninflammatory disorders of vulva and perineum: Secondary | ICD-10-CM

## 2013-02-15 LAB — POCT URINALYSIS DIPSTICK
Glucose, UA: NEGATIVE
Nitrite, UA: NEGATIVE
Spec Grav, UA: 1.025
Urobilinogen, UA: 0.2

## 2013-02-15 MED ORDER — CIPROFLOXACIN HCL 500 MG PO TABS: 500.0000 mg | ORAL_TABLET | Freq: Two times a day (BID) | ORAL | Status: DC

## 2013-02-15 MED ORDER — FLUCONAZOLE 150 MG PO TABS: 150.0000 mg | ORAL_TABLET | Freq: Once | ORAL | Status: DC

## 2013-02-15 NOTE — Progress Notes (Signed)
  Subjective:    Patient ID: [name], female    DOB: 07-03-1985, 28 y.o.   MRN: [id number]  HPI 27 yo M lady with 2 complaints.  1) she feels like she is developing a bladder infection. She has had these in the past. 2) About 1 month ago she developed pain and swelling, like cysts on her right labia minora. They reached a head an eventually popped open after about 2 weeks. This has now occurred on the left side as well.   Review of Systems She has already had her flu vaccine this season.    Objective:   Physical Exam        Assessment & Plan:  1) Probable UTi- treat with cipro. Culture urine 2) sebaceous cyst of labia minora- I offered to open it, but she prefers watchful waiting.

## 2013-02-16 LAB — CULTURE, URINE COMPREHENSIVE: Colony Count: NO GROWTH

## 2013-03-17 ENCOUNTER — Encounter: Payer: Self-pay | Admitting: Emergency Medicine

## 2013-03-17 ENCOUNTER — Emergency Department
Admission: EM | Admit: 2013-03-17 | Discharge: 2013-03-17 | Disposition: A | Discharge status: Home / Self Care | Payer: 59 | Source: Home / Self Care | Attending: Family Medicine | Admitting: Family Medicine

## 2013-03-17 DIAGNOSIS — J029 Acute pharyngitis, unspecified: Secondary | ICD-10-CM

## 2013-03-17 DIAGNOSIS — J069 Acute upper respiratory infection, unspecified: Secondary | ICD-10-CM

## 2013-03-17 DIAGNOSIS — J9801 Acute bronchospasm: Secondary | ICD-10-CM

## 2013-03-17 MED ORDER — PREDNISONE 20 MG PO TABS: 20.0000 mg | ORAL_TABLET | Freq: Two times a day (BID) | ORAL | Status: DC

## 2013-03-17 MED ORDER — AZITHROMYCIN 250 MG PO TABS: ORAL_TABLET | ORAL | Status: DC

## 2013-03-17 MED ORDER — ALBUTEROL SULFATE HFA 108 (90 BASE) MCG/ACT IN AERS: 2.0000 | INHALATION_SPRAY | RESPIRATORY_TRACT | Status: DC | PRN

## 2013-03-17 NOTE — ED Notes (Signed)
[name] complains of ear pain, sore throat, sneezing, congestion, wheezing, cough, hoarseness, shortness of breath and nausea for 1.5 weeks. Denies fever, chills or sweats.

## 2013-03-17 NOTE — ED Provider Notes (Signed)
CSN: [id number]     Arrival date & time 03/17/13  4098 History   First MD Initiated Contact with Patient 03/17/13 1010     Chief Complaint  Patient presents with  . Cough    1.5 weeks  . Sore Throat    1.5 weeks  . Wheezing    1.5 weeks      HPI Comments: Patient developed a sore throat, fatigue, and myalgias about 10 days ago.  The sore throat has now resolved.  About one week ago she developed a partly productive cough.  No fevers, chills, and sweats.  No pleuritic pain.  Recently she has started to wheeze and feel shortness of breath.  She tried her daughter's albuterol inhaler with improvement. She states that she always develops bronchospasm when she gets a cold, but never has asthma symptoms when she is well.  She has a family history of asthma in maternal aunt and her daughter.  The history is provided by the patient.    Past Medical History  Diagnosis Date  . Asthma   . Migraines   . GERD (gastroesophageal reflux disease)   . Obesity    Past Surgical History  Procedure Laterality Date  . Knee surgeries    . Cholecystectomy     Family History  Problem Relation Age of Onset  . Migraines Other   . Bipolar disorder Mother   . Diabetes Other   . Heart failure Other   . Heart attack Other   . Diabetes Other     type 1  . Heart disease Other   . Heart attack Other   . Heart disease Paternal Grandmother   . Heart disease Paternal Grandfather    History  Substance Use Topics  . Smoking status: Never Smoker   . Smokeless tobacco: Not on file  . Alcohol Use: No   OB History   Grav Para Term Preterm Abortions TAB SAB Ect Mult Living   2 2 1 1  0 0 0 0 0 2     Review of Systems + sore throat, resolved + cough No pleuritic pain + wheezing Minimal nasal congestion No post-nasal drainage No sinus pain/pressure No itchy/red eyes ? earache No hemoptysis + SOB when wheezing No fever, ? chills No nausea No vomiting No abdominal pain No diarrhea No urinary  symptoms No skin rash + fatigue + myalgias, resolved No headache Used OTC meds without relief   Allergies  Codeine; Dilaudid; Morphine and related; and Sudafed  Home Medications   Current Outpatient Rx  Name  Route  Sig  Dispense  Refill  . acetaminophen (TYLENOL) 325 MG tablet   Oral   Take 650 mg by mouth every 6 (six) hours as needed.         . carisoprodol (SOMA) 350 MG tablet   Oral   Take 350 mg by mouth 4 (four) times daily as needed for muscle spasms.         Marland Kitchen esomeprazole (NEXIUM) 20 MG capsule   Oral   Take 20 mg by mouth daily before breakfast.           . ibuprofen (ADVIL,MOTRIN) 200 MG tablet   Oral   Take 200 mg by mouth every 6 (six) hours as needed.         . naproxen sodium (ANAPROX) 550 MG tablet   Oral   Take 1 tablet (550 mg total) by mouth 2 (two) times daily with a meal.   60 tablet  1   . ondansetron (ZOFRAN) 4 MG tablet   Oral   Take 1 tablet (4 mg total) by mouth every 6 (six) hours. as needed for nausea   12 tablet   0   . promethazine (PHENERGAN) 25 MG tablet   Oral   Take 1 tablet (25 mg total) by mouth every 6 (six) hours as needed for nausea.   12 tablet   0   . rizatriptan (MAXALT) 10 MG tablet   Oral   Take 1 tablet (10 mg total) by mouth as needed for migraine. May repeat in 2 hours if needed   12 tablet   11   . traMADol (ULTRAM) 50 MG tablet   Oral   Take 1 tablet (50 mg total) by mouth every 6 (six) hours as needed for pain.   15 tablet   1   . albuterol (PROVENTIL HFA;VENTOLIN HFA) 108 (90 BASE) MCG/ACT inhaler   Inhalation   Inhale 2 puffs into the lungs every 4 (four) hours as needed.   1 Inhaler   0   . azithromycin (ZITHROMAX Z-PAK) 250 MG tablet      Take 2 tabs today; then begin one tab once daily for 4 more days. (Rx void after 03/25/13)   6 each   0   . predniSONE (DELTASONE) 20 MG tablet   Oral   Take 1 tablet (20 mg total) by mouth 2 (two) times daily.   10 tablet   0   . EXPIRED:  zolpidem (AMBIEN) 10 MG tablet   Oral   Take 1 tablet (10 mg total) by mouth at bedtime as needed for sleep.   30 tablet   2    BP 115/82  Pulse 81  Temp(Src) 98.4 F (36.9 C) (Oral)  Ht 5\' 5"  (1.651 m)  Wt 203 lb (92.08 kg)  BMI 33.78 kg/m2  SpO2 94%  LMP 01/17/2013 Physical Exam Nursing notes and Vital Signs reviewed. Appearance:  Patient appears stated age, and in no acute distress.  Patient is obese (BMI 33.6) Eyes:  Pupils are equal, round, and reactive to light and accomodation.  Extraocular movement is intact.  Conjunctivae are not inflamed  Ears:  Canals normal.  Tympanic membranes normal.  Nose:  Mildly congested turbinates.  No sinus tenderness.   Pharynx:  Normal Neck:  Supple.  Non tender shotty anterior/posterior nodes are palpated bilaterally  Lungs:  Clear to auscultation.  Breath sounds are equal.  Heart:  Regular rate and rhythm without murmurs, rubs, or gallops.  Abdomen:  Nontender without masses or hepatosplenomegaly.  Bowel sounds are present.  No CVA or flank tenderness.  Extremities:  No edema.  No calf tenderness Skin:  No rash present.   ED Course  Procedures  None   Labs Reviewed  POCT RAPID STREP A (OFFICE) - Normal         MDM   1. Sore throat   2. Acute upper respiratory infections of unspecified site; suspect viral URI  3. Bronchospasm    There is no evidence of bacterial infection today.   Begin prednisone burst. Begin albuterol inhaler. Take plain Mucinex (guaifenesin) twice daily for cough and congestion.  Increase fluid intake, rest. May use Afrin nasal spray (or generic oxymetazoline) twice daily for about 5 days.  Also recommend using saline nasal spray several times daily and saline nasal irrigation (AYR is a common brand) Stop all antihistamines for now, and other non-prescription cough/cold preparations. May take Delsym Cough Suppressant at  bedtime for nighttime cough.  Begin Azithromycin if not improving about 5 days or if  persistent fever develops (Given a prescription to hold, with an expiration date)  Follow-up with family doctor if not improving 7 to 10 days.  Consider discussing an asthma plan with her PCP    [name], MD 03/17/13 1124

## 2013-04-24 ENCOUNTER — Telehealth: Payer: Self-pay | Admitting: *Deleted

## 2013-04-24 NOTE — Telephone Encounter (Signed)
Prior authorization submitted to [employer] for Botox to treat migraine headaches.

## 2013-05-01 ENCOUNTER — Encounter: Payer: Self-pay | Admitting: Nurse Practitioner

## 2013-05-01 ENCOUNTER — Encounter: Payer: 59 | Admitting: Nurse Practitioner

## 2013-05-01 ENCOUNTER — Ambulatory Visit (INDEPENDENT_AMBULATORY_CARE_PROVIDER_SITE_OTHER): Payer: 59 | Admitting: Nurse Practitioner

## 2013-05-01 VITALS — BP 102/70 | HR 77 | Resp 16 | Ht 64.0 in | Wt 203.0 lb

## 2013-05-01 DIAGNOSIS — M2550 Pain in unspecified joint: Secondary | ICD-10-CM

## 2013-05-01 DIAGNOSIS — G43909 Migraine, unspecified, not intractable, without status migrainosus: Secondary | ICD-10-CM

## 2013-05-01 NOTE — Patient Instructions (Signed)
Migraine Headache A migraine headache is an intense, throbbing pain on one or both sides of your head. A migraine can last for 30 minutes to several hours. CAUSES  The exact cause of a migraine headache is not always known. However, a migraine may be caused when nerves in the brain become irritated and release chemicals that cause inflammation. This causes pain. SYMPTOMS  Pain on one or both sides of your head.  Pulsating or throbbing pain.  Severe pain that prevents daily activities.  Pain that is aggravated by any physical activity.  Nausea, vomiting, or both.  Dizziness.  Pain with exposure to bright lights, loud noises, or activity.  General sensitivity to bright lights, loud noises, or smells. Before you get a migraine, you may get warning signs that a migraine is coming (aura). An aura may include:  Seeing flashing lights.  Seeing bright spots, halos, or zig-zag lines.  Having tunnel vision or blurred vision.  Having feelings of numbness or tingling.  Having trouble talking.  Having muscle weakness. MIGRAINE TRIGGERS  Alcohol.  Smoking.  Stress.  Menstruation.  Aged cheeses.  Foods or drinks that contain nitrates, glutamate, aspartame, or tyramine.  Lack of sleep.  Chocolate.  Caffeine.  Hunger.  Physical exertion.  Fatigue.  Medicines used to treat chest pain (nitroglycerine), birth control pills, estrogen, and some blood pressure medicines. DIAGNOSIS  A migraine headache is often diagnosed based on:  Symptoms.  Physical examination.  A CT scan or MRI of your head. TREATMENT Medicines may be given for pain and nausea. Medicines can also be given to help prevent recurrent migraines.  HOME CARE INSTRUCTIONS  Only take over-the-counter or prescription medicines for pain or discomfort as directed by your caregiver. The use of long-term narcotics is not recommended.  Lie down in a dark, quiet room when you have a migraine.  Keep a journal  to find out what may trigger your migraine headaches. For example, write down:  What you eat and drink.  How much sleep you get.  Any change to your diet or medicines.  Limit alcohol consumption.  Quit smoking if you smoke.  Get 7 to 9 hours of sleep, or as recommended by your caregiver.  Limit stress.  Keep lights dim if bright lights bother you and make your migraines worse. SEEK IMMEDIATE MEDICAL CARE IF:   Your migraine becomes severe.  You have a fever.  You have a stiff neck.  You have vision loss.  You have muscular weakness or loss of muscle control.  You start losing your balance or have trouble walking.  You feel faint or pass out.  You have severe symptoms that are different from your first symptoms. MAKE SURE YOU:   Understand these instructions.  Will watch your condition.  Will get help right away if you are not doing well or get worse. Document Released: 04/12/2005 Document Revised: 07/05/2011 Document Reviewed: 04/02/2011 [employer] Patient Information 2014 [employer].  

## 2013-05-01 NOTE — Progress Notes (Signed)
History:  [name] is a 28 y.o. G2P1102 who [facility] follow up on migraines. She was waiting to return when her Botox was approved and it was never requested. She is still very interested in Botox. The Effexor gave her racing heart rate and she was unable to stay on it. She has been on most of the SSRI's and wellbutrin and had to come off due to some side effect. She has gotten the Nexplanon and it is working well but she did have a number of vaginal cysts that she wondered might be related. Today she also complains of joint aches from hip and knees. She does not exercise at all and sits at a desk. The Maxalt is working well. She forgot to take the Ambien. She and her husband have been working with the 3 y.o in behavior issues and she is getting more sleep.  The following portions of the patient's history were reviewed and updated as appropriate: allergies, current medications, past family history, past medical history, past social history, past surgical history and problem list.  Review of Systems:  Pertinent items are noted in HPI.  Objective:  Physical Exam BP 102/70  Pulse 77  Resp 16  Ht 5\' 4"  (1.626 m)  Wt 203 lb (92.08 kg)  BMI 34.83 kg/m2  LMP 02/12/2013 GENERAL: Well-developed, well-nourished female in no acute distress.  HEENT: Normocephalic, atraumatic.  NECK: Supple. Normal thyroid.  LUNGS: Normal rate. Clear to auscultation bilaterally.  HEART: Regular rate and rhythm with no adventitious sounds.  EXTREMITIES: No cyanosis, clubbing, or edema, 2+ distal pulses.   Labs and Imaging No results found.  Assessment & Plan:  Assessment:  Migraine Joint Pains  Plans:  Continue with pursuit of Botox Check sed rate Continue with Maxalt  Delbert PhenixLinda M Mariapaula Krist, NP 05/01/2013 1:59 PM

## 2013-05-02 ENCOUNTER — Telehealth: Payer: Self-pay | Admitting: *Deleted

## 2013-05-02 LAB — SEDIMENTATION RATE: SED RATE: 8 mm/h (ref 0–22)

## 2013-05-02 NOTE — Telephone Encounter (Signed)
Called pt to adv Sed Rate WNL still waiting on Botox approval.

## 2013-05-27 ENCOUNTER — Encounter: Payer: Self-pay | Admitting: Emergency Medicine

## 2013-05-27 ENCOUNTER — Emergency Department
Admission: EM | Admit: 2013-05-27 | Discharge: 2013-05-27 | Disposition: A | Discharge status: Home / Self Care | Payer: 59 | Source: Home / Self Care | Attending: Family Medicine | Admitting: Family Medicine

## 2013-05-27 DIAGNOSIS — R51 Headache: Secondary | ICD-10-CM

## 2013-05-27 DIAGNOSIS — R519 Headache, unspecified: Secondary | ICD-10-CM

## 2013-05-27 MED ORDER — KETOROLAC TROMETHAMINE 60 MG/2ML IM SOLN
60.0000 mg | Freq: Once | INTRAMUSCULAR | Status: AC
  Administered 2013-05-27: 60 mg via INTRAMUSCULAR

## 2013-05-27 MED ORDER — PROMETHAZINE HCL 25 MG/ML IJ SOLN
25.0000 mg | Freq: Once | INTRAMUSCULAR | Status: AC
  Administered 2013-05-27: 25 mg via INTRAMUSCULAR

## 2013-05-27 NOTE — ED Provider Notes (Signed)
CSN: [id number]     Arrival date & time 05/27/13  1601 History   First MD Initiated Contact with Patient 05/27/13 1619     Chief Complaint  Patient presents with  . Migraine  . Otalgia     HPI Comments: Patient complains of a typical, but persistent right sided daily headache for two weeks.  She notes that Maxalt daily is somewhat helpful but the headache recurs.  However, Maxalt did not decrease the headache today.  She also notes pain in her right ear for one week.  She has had nausea without vomiting.  No fevers, chills, and sweats.  No other neuro symptoms.  She states that she is scheduled for Botox injections in her right neck area in the near future for treatment of her right neck and TMJ pain.  She states that she has not been wearing a night guard recently.  Patient is a 28 y.o. female presenting with headaches. The history is provided by the patient.  Headache Pain location:  R parietal Quality:  Dull Radiates to:  Does not radiate Severity currently:  8/10 Severity at highest:  9/10 Onset quality:  Gradual Duration:  2 weeks Timing:  Intermittent Progression:  Worsening Chronicity:  Chronic Similar to prior headaches: yes   Relieved by: Maxalt. Worsened by:  Light Ineffective treatments:  Prescription medications Associated symptoms: congestion, ear pain, eye pain, nausea, neck pain, neck stiffness and photophobia   Associated symptoms: no abdominal pain, no blurred vision, no cough, no dizziness, no facial pain, no fatigue, no fever, no focal weakness, no hearing loss, no loss of balance, no myalgias, no near-syncope, no numbness, no paresthesias, no seizures, no sinus pressure, no sore throat, no swollen glands, no syncope, no tingling, no visual change, no vomiting and no weakness     Past Medical History  Diagnosis Date  . Asthma   . Migraines   . GERD (gastroesophageal reflux disease)   . Obesity    Past Surgical History  Procedure Laterality Date  . Knee  surgeries    . Cholecystectomy     Family History  Problem Relation Age of Onset  . Migraines Other   . Bipolar disorder Mother   . Diabetes Other   . Heart failure Other   . Heart attack Other   . Diabetes Other     type 1  . Heart disease Other   . Heart attack Other   . Heart disease Paternal Grandmother   . Heart disease Paternal Grandfather    History  Substance Use Topics  . Smoking status: Never Smoker   . Smokeless tobacco: Not on file  . Alcohol Use: No   OB History   Grav Para Term Preterm Abortions TAB SAB Ect Mult Living   2 2 1 1  0 0 0 0 0 2     Review of Systems  Constitutional: Negative for fever and fatigue.  HENT: Positive for congestion and ear pain. Negative for hearing loss, sinus pressure and sore throat.   Eyes: Positive for photophobia and pain. Negative for blurred vision.  Respiratory: Negative for cough.   Cardiovascular: Negative for syncope and near-syncope.  Gastrointestinal: Positive for nausea. Negative for vomiting and abdominal pain.  Musculoskeletal: Positive for neck pain and neck stiffness. Negative for myalgias.  Neurological: Positive for headaches. Negative for dizziness, focal weakness, seizures, numbness, paresthesias and loss of balance.  All other systems reviewed and are negative.    Allergies  Codeine; Dilaudid; Morphine and related; Sudafed;  and Tramadol  Home Medications   Current Outpatient Rx  Name  Route  Sig  Dispense  Refill  . acetaminophen (TYLENOL) 325 MG tablet   Oral   Take 650 mg by mouth every 6 (six) hours as needed.         Marland Kitchen. albuterol (PROVENTIL HFA;VENTOLIN HFA) 108 (90 BASE) MCG/ACT inhaler   Inhalation   Inhale 2 puffs into the lungs every 4 (four) hours as needed.   1 Inhaler   0   . carisoprodol (SOMA) 350 MG tablet   Oral   Take 350 mg by mouth 4 (four) times daily as needed for muscle spasms.         Marland Kitchen. esomeprazole (NEXIUM) 20 MG capsule   Oral   Take 20 mg by mouth daily before  breakfast.           . ibuprofen (ADVIL,MOTRIN) 200 MG tablet   Oral   Take 200 mg by mouth every 6 (six) hours as needed.         Marland Kitchen. ketorolac (TORADOL) 10 MG tablet               . naproxen sodium (ANAPROX) 550 MG tablet   Oral   Take 1 tablet (550 mg total) by mouth 2 (two) times daily with a meal.   60 tablet   1   . ondansetron (ZOFRAN) 4 MG tablet   Oral   Take 1 tablet (4 mg total) by mouth every 6 (six) hours. as needed for nausea   12 tablet   0   . promethazine (PHENERGAN) 25 MG tablet   Oral   Take 1 tablet (25 mg total) by mouth every 6 (six) hours as needed for nausea.   12 tablet   0   . rizatriptan (MAXALT) 10 MG tablet   Oral   Take 1 tablet (10 mg total) by mouth as needed for migraine. May repeat in 2 hours if needed   12 tablet   11   . EXPIRED: zolpidem (AMBIEN) 10 MG tablet   Oral   Take 1 tablet (10 mg total) by mouth at bedtime as needed for sleep.   30 tablet   2    BP 109/75  Pulse 85  Temp(Src) 98.8 F (37.1 C) (Oral)  Resp 16  Ht 5\' 3"  (1.6 m)  Wt 204 lb (92.534 kg)  BMI 36.15 kg/m2  SpO2 99%  LMP 02/12/2013 Physical Exam Nursing notes and Vital Signs reviewed. Appearance:  Patient appears stated age, and in no acute distress.  Patient is obese (BMI 36.2).  She is alert and oriented.  Eyes:  Pupils are equal, round, and reactive to light and accomodation.  Extraocular movement is intact.  Conjunctivae are not inflamed.  Fundi are benign.  Mild photophobia present  Ears:  Canals normal.  Tympanic membranes normal.  There is distinct tenderness over the right  temporomandibular joint.  Palpation there recreates her pain.   Nose:   Normal turbinates.  No sinus tenderness.    Mouth/Pharynx:  Normal Neck:  Supple.  No adenopathy.  Tenderness over the right trapezius and sternocleidomastoid muscle radiating into right occipital area. Lungs:  Clear to auscultation.  Breath sounds are equal.  Heart:  Regular rate and rhythm without  murmurs, rubs, or gallops.  Abdomen:  Nontender without masses or hepatosplenomegaly.  Bowel sounds are present.  No CVA or flank tenderness.  Extremities:  No edema.  No calf tenderness Skin:  No rash  present. Neurologic:  Cranial nerves 2 through 12 are normal.    .   ED Course  Procedures  none       MDM   1. Recurrent headache; probably secondary to chronic right TMJ pain    Toradol 60mg  IM plus Phenergan 25mg  IM. May continue oral Toradol 10mg , one every 4 to 6 hours (not more than 4 in 24 hours).  Do not take more than 4 more days (patient has Toradol at home). If symptoms become significantly worse during the night or over the weekend, proceed to the local emergency room.       Followup with PCP            [name], MD 05/28/13 [phone]

## 2013-05-27 NOTE — Discharge Instructions (Signed)
May continue oral Toradol 10mg , one every 4 to 6 hours (not more than 4 in 24 hours).  Do not take more than 4 more days. If symptoms become significantly worse during the night or over the weekend, proceed to the local emergency room.

## 2013-05-27 NOTE — ED Notes (Signed)
Reports 2 weeks of intermittent migraines; taking Maxalt q 2 hours (2 doses today) without complete relief.

## 2013-05-29 ENCOUNTER — Encounter: Payer: 59 | Admitting: Nurse Practitioner

## 2013-06-05 ENCOUNTER — Ambulatory Visit (INDEPENDENT_AMBULATORY_CARE_PROVIDER_SITE_OTHER): Payer: 59 | Admitting: Nurse Practitioner

## 2013-06-05 ENCOUNTER — Encounter: Payer: Self-pay | Admitting: Nurse Practitioner

## 2013-06-05 VITALS — BP 110/78 | HR 86 | Ht 64.0 in | Wt 208.0 lb

## 2013-06-05 DIAGNOSIS — M62838 Other muscle spasm: Secondary | ICD-10-CM

## 2013-06-05 DIAGNOSIS — G43909 Migraine, unspecified, not intractable, without status migrainosus: Secondary | ICD-10-CM

## 2013-06-05 MED ORDER — PROMETHAZINE HCL 25 MG PO TABS: 25.0000 mg | ORAL_TABLET | Freq: Four times a day (QID) | ORAL | Status: DC | PRN

## 2013-06-05 MED ORDER — CARISOPRODOL 350 MG PO TABS: 350.0000 mg | ORAL_TABLET | Freq: Two times a day (BID) | ORAL | Status: DC | PRN

## 2013-06-05 NOTE — Patient Instructions (Signed)
Migraine Headache A migraine headache is an intense, throbbing pain on one or both sides of your head. A migraine can last for 30 minutes to several hours. CAUSES  The exact cause of a migraine headache is not always known. However, a migraine may be caused when nerves in the brain become irritated and release chemicals that cause inflammation. This causes pain. Certain things may also trigger migraines, such as:  Alcohol.  Smoking.  Stress.  Menstruation.  Aged cheeses.  Foods or drinks that contain nitrates, glutamate, aspartame, or tyramine.  Lack of sleep.  Chocolate.  Caffeine.  Hunger.  Physical exertion.  Fatigue.  Medicines used to treat chest pain (nitroglycerine), birth control pills, estrogen, and some blood pressure medicines. SIGNS AND SYMPTOMS  Pain on one or both sides of your head.  Pulsating or throbbing pain.  Severe pain that prevents daily activities.  Pain that is aggravated by any physical activity.  Nausea, vomiting, or both.  Dizziness.  Pain with exposure to bright lights, loud noises, or activity.  General sensitivity to bright lights, loud noises, or smells. Before you get a migraine, you may get warning signs that a migraine is coming (aura). An aura may include:  Seeing flashing lights.  Seeing bright spots, halos, or zig-zag lines.  Having tunnel vision or blurred vision.  Having feelings of numbness or tingling.  Having trouble talking.  Having muscle weakness. DIAGNOSIS  A migraine headache is often diagnosed based on:  Symptoms.  Physical exam.  A CT scan or MRI of your head. These imaging tests cannot diagnose migraines, but they can help rule out other causes of headaches. TREATMENT Medicines may be given for pain and nausea. Medicines can also be given to help prevent recurrent migraines.  HOME CARE INSTRUCTIONS  Only take over-the-counter or prescription medicines for pain or discomfort as directed by your  health care provider. The use of long-term narcotics is not recommended.  Lie down in a dark, quiet room when you have a migraine.  Keep a journal to find out what may trigger your migraine headaches. For example, write down:  What you eat and drink.  How much sleep you get.  Any change to your diet or medicines.  Limit alcohol consumption.  Quit smoking if you smoke.  Get 7 9 hours of sleep, or as recommended by your health care provider.  Limit stress.  Keep lights dim if bright lights bother you and make your migraines worse. SEEK IMMEDIATE MEDICAL CARE IF:   Your migraine becomes severe.  You have a fever.  You have a stiff neck.  You have vision loss.  You have muscular weakness or loss of muscle control.  You start losing your balance or have trouble walking.  You feel faint or pass out.  You have severe symptoms that are different from your first symptoms. MAKE SURE YOU:   Understand these instructions.  Will watch your condition.  Will get help right away if you are not doing well or get worse. Document Released: 04/12/2005 Document Revised: 01/31/2013 Document Reviewed: 12/18/2012 [employer] Patient Information 2014 [employer].  

## 2013-06-05 NOTE — Progress Notes (Signed)
RX for patient printed and were not sent through epic.  I have verbally called phenergan and soma prescriptions into [facility].

## 2013-06-05 NOTE — Progress Notes (Signed)
S: Pt in office today for Botox injections for the first time. She has great difficulty taking medications due to side effects.     O: Muscles on right side of body are tighter than left. TMJ pain in right side  Botox Procedure Note/ Pt brought her own BOTOX 100 units Lot # [id number] Expiration Date : Apr 2017  Botox Dosing by [employer] for Chronic Migraine   Injection Sites for Migraines  Botox 100 units was injected using the dosage in the table above in the pattern shown above. In addition 3 0.51ml in TMJ regions  A: Migraine  Muscle spasm  TMJ  P: Botox 100 units injected today.  Refill Phenergan, Soma RTC 2-3  Months.  Take Motrin 800 mg TID for site pains

## 2013-07-31 ENCOUNTER — Ambulatory Visit (INDEPENDENT_AMBULATORY_CARE_PROVIDER_SITE_OTHER): Payer: 59 | Admitting: Nurse Practitioner

## 2013-07-31 ENCOUNTER — Encounter: Payer: Self-pay | Admitting: Nurse Practitioner

## 2013-07-31 VITALS — BP 100/82 | HR 76 | Resp 16 | Ht 64.0 in | Wt 212.0 lb

## 2013-07-31 DIAGNOSIS — G43909 Migraine, unspecified, not intractable, without status migrainosus: Secondary | ICD-10-CM

## 2013-07-31 DIAGNOSIS — M62838 Other muscle spasm: Secondary | ICD-10-CM

## 2013-07-31 NOTE — Progress Notes (Signed)
S: Pt in office today for Botox injections. The Botox was somewhat helpful and she noticed about a 10% improvement in her migraines. 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 She thinks these have made a difference also/ She is using 100 units as needed. Going up on Tresa GarterSoma has helped also. Her TMJ is better.   O: Neck and traps are still quite tight  Botox Procedure Note/ Patient brought her own Botox Lot # [id number] Expiration Date April 2017  Botox Dosing by [employer] for Chronic Migraine   Injection Sites for Migraines  Botox 100 units was injected using the dosage in the table above in the pattern shown above.  A: Migraine  Muscle spasm   P: Botox 100 units injected today in the usual pattern Continue Lyrica as prescribed RTC 3 Months.

## 2013-07-31 NOTE — Patient Instructions (Signed)
Migraine Headache A migraine headache is an intense, throbbing pain on one or both sides of your head. A migraine can last for 30 minutes to several hours. CAUSES  The exact cause of a migraine headache is not always known. However, a migraine may be caused when nerves in the brain become irritated and release chemicals that cause inflammation. This causes pain. Certain things may also trigger migraines, such as:  Alcohol.  Smoking.  Stress.  Menstruation.  Aged cheeses.  Foods or drinks that contain nitrates, glutamate, aspartame, or tyramine.  Lack of sleep.  Chocolate.  Caffeine.  Hunger.  Physical exertion.  Fatigue.  Medicines used to treat chest pain (nitroglycerine), birth control pills, estrogen, and some blood pressure medicines. SIGNS AND SYMPTOMS  Pain on one or both sides of your head.  Pulsating or throbbing pain.  Severe pain that prevents daily activities.  Pain that is aggravated by any physical activity.  Nausea, vomiting, or both.  Dizziness.  Pain with exposure to bright lights, loud noises, or activity.  General sensitivity to bright lights, loud noises, or smells. Before you get a migraine, you may get warning signs that a migraine is coming (aura). An aura may include:  Seeing flashing lights.  Seeing bright spots, halos, or zig-zag lines.  Having tunnel vision or blurred vision.  Having feelings of numbness or tingling.  Having trouble talking.  Having muscle weakness. DIAGNOSIS  A migraine headache is often diagnosed based on:  Symptoms.  Physical exam.  A CT scan or MRI of your head. These imaging tests cannot diagnose migraines, but they can help rule out other causes of headaches. TREATMENT Medicines may be given for pain and nausea. Medicines can also be given to help prevent recurrent migraines.  HOME CARE INSTRUCTIONS  Only take over-the-counter or prescription medicines for pain or discomfort as directed by your  health care provider. The use of long-term narcotics is not recommended.  Lie down in a dark, quiet room when you have a migraine.  Keep a journal to find out what may trigger your migraine headaches. For example, write down:  What you eat and drink.  How much sleep you get.  Any change to your diet or medicines.  Limit alcohol consumption.  Quit smoking if you smoke.  Get 7 9 hours of sleep, or as recommended by your health care provider.  Limit stress.  Keep lights dim if bright lights bother you and make your migraines worse. SEEK IMMEDIATE MEDICAL CARE IF:   Your migraine becomes severe.  You have a fever.  You have a stiff neck.  You have vision loss.  You have muscular weakness or loss of muscle control.  You start losing your balance or have trouble walking.  You feel faint or pass out.  You have severe symptoms that are different from your first symptoms. MAKE SURE YOU:   Understand these instructions.  Will watch your condition.  Will get help right away if you are not doing well or get worse. Document Released: 04/12/2005 Document Revised: 01/31/2013 Document Reviewed: 12/18/2012 [employer] Patient Information 2014 [employer].  

## 2013-08-20 ENCOUNTER — Encounter: Payer: Self-pay | Admitting: Emergency Medicine

## 2013-08-20 ENCOUNTER — Emergency Department
Admission: EM | Admit: 2013-08-20 | Discharge: 2013-08-20 | Disposition: A | Discharge status: Home / Self Care | Payer: 59 | Source: Home / Self Care

## 2013-08-20 DIAGNOSIS — G43909 Migraine, unspecified, not intractable, without status migrainosus: Secondary | ICD-10-CM

## 2013-08-20 MED ORDER — PROMETHAZINE HCL 25 MG/ML IJ SOLN: 25.0000 mg | Freq: Once | INTRAMUSCULAR | Status: DC

## 2013-08-20 MED ORDER — KETOROLAC TROMETHAMINE 10 MG PO TABS: ORAL_TABLET | ORAL | Status: DC

## 2013-08-20 MED ORDER — KETOROLAC TROMETHAMINE 60 MG/2ML IM SOLN: 60.0000 mg | Freq: Once | INTRAMUSCULAR | Status: DC

## 2013-08-20 MED ORDER — PROMETHAZINE HCL 25 MG/ML IJ SOLN
25.0000 mg | Freq: Once | INTRAMUSCULAR | Status: AC
  Administered 2013-08-20: 25 mg via INTRAMUSCULAR

## 2013-08-20 MED ORDER — KETOROLAC TROMETHAMINE 60 MG/2ML IM SOLN
60.0000 mg | Freq: Once | INTRAMUSCULAR | Status: AC
  Administered 2013-08-20: 60 mg via INTRAMUSCULAR

## 2013-08-20 NOTE — ED Provider Notes (Signed)
CSN: [id number]     Arrival date & time 08/20/13  1840 History   None    Chief Complaint  Patient presents with  . Headache      HPI Comments: Patient complains of a typical, but worse than usual right sided migraine headache that started yesterday.   Maxalt did not decrease the headache.   She also notes typical pain in her right ear. She has had nausea without vomiting.  No fevers, chills, and sweats.  No other neuro symptoms.  She states that she has been undergoing Botox injections in her right neck area for treatment of her right neck and TMJ pain.     Patient is a 28 y.o. female presenting with headaches.  Headache Pain location:  R parietal and R temporal Quality:  Dull Radiates to:  Does not radiate Onset quality:  Gradual Duration:  1 day Timing:  Constant Progression:  Unchanged Chronicity:  Recurrent Similar to prior headaches: yes   Context: activity, bright light and loud noise   Relieved by:  Nothing Worsened by:  Light Ineffective treatments:  Prescription medications Associated symptoms: nausea and photophobia   Associated symptoms: no blurred vision, no congestion, no dizziness, no ear pain, no pain, no facial pain, no fever, no focal weakness, no hearing loss, no loss of balance, no neck pain, no neck stiffness, no numbness, no paresthesias, no sinus pressure and no vomiting     Past Medical History  Diagnosis Date  . Asthma   . Migraines   . GERD (gastroesophageal reflux disease)   . Obesity   . Shingles    Past Surgical History  Procedure Laterality Date  . Knee surgeries    . Cholecystectomy     Family History  Problem Relation Age of Onset  . Migraines Other   . Bipolar disorder Mother   . Diabetes Other   . Heart failure Other   . Heart attack Other   . Diabetes Other     type 1  . Heart disease Other   . Heart attack Other   . Heart disease Paternal Grandmother   . Heart disease Paternal Grandfather    History  Substance Use Topics  .  Smoking status: Never Smoker   . Smokeless tobacco: Not on file  . Alcohol Use: No   OB History   Grav Para Term Preterm Abortions TAB SAB Ect Mult Living   2 2 1 1  0 0 0 0 0 2     Review of Systems  Constitutional: Negative for fever.  HENT: Negative for congestion, ear pain, hearing loss and sinus pressure.   Eyes: Positive for photophobia. Negative for blurred vision and pain.  Gastrointestinal: Positive for nausea. Negative for vomiting.  Musculoskeletal: Negative for neck pain and neck stiffness.  Neurological: Positive for headaches. Negative for dizziness, focal weakness, numbness, paresthesias and loss of balance.  All other systems reviewed and are negative.   Allergies  Codeine; Dilaudid; Morphine and related; Sudafed; and Tramadol  Home Medications   Prior to Admission medications   Medication Sig Start Date End Date Taking? Authorizing Provider  acetaminophen (TYLENOL) 325 MG tablet Take 650 mg by mouth every 6 (six) hours as needed.    Historical Provider, MD  albuterol (PROVENTIL HFA;VENTOLIN HFA) 108 (90 BASE) MCG/ACT inhaler Inhale 2 puffs into the lungs every 4 (four) hours as needed. 03/17/13   Lattie HawStephen A Beese, MD  carisoprodol (SOMA) 350 MG tablet Take 1 tablet (350 mg total) by mouth 2 (  two) times daily as needed for muscle spasms. 06/05/13   Delbert PhenixLinda M Barefoot, NP  esomeprazole (NEXIUM) 20 MG capsule Take 20 mg by mouth daily before breakfast.      Historical Provider, MD  ibuprofen (ADVIL,MOTRIN) 200 MG tablet Take 200 mg by mouth every 6 (six) hours as needed.    Historical Provider, MD  ketorolac (TORADOL) 10 MG tablet  04/13/13   Historical Provider, MD  naproxen sodium (ANAPROX) 550 MG tablet Take 1 tablet (550 mg total) by mouth 2 (two) times daily with a meal. 01/02/13   Delbert PhenixLinda M Barefoot, NP  ondansetron (ZOFRAN) 4 MG tablet Take 1 tablet (4 mg total) by mouth every 6 (six) hours. as needed for nausea 10/03/12   Lattie HawStephen A Beese, MD  pregabalin (LYRICA) 75 MG  capsule Take 75 mg by mouth daily.    Historical Provider, MD  promethazine (PHENERGAN) 25 MG tablet Take 1 tablet (25 mg total) by mouth every 6 (six) hours as needed for nausea. 06/05/13   Delbert PhenixLinda M Barefoot, NP  rizatriptan (MAXALT) 10 MG tablet Take 1 tablet (10 mg total) by mouth as needed for migraine. May repeat in 2 hours if needed 01/02/13   Delbert PhenixLinda M Barefoot, NP  traMADol Janean Sark(ULTRAM) 50 MG tablet  05/18/13   Historical Provider, MD  valACYclovir (VALTREX) 1000 MG tablet  07/25/13   Historical Provider, MD  zolpidem (AMBIEN) 10 MG tablet Take 1 tablet (10 mg total) by mouth at bedtime as needed for sleep. 01/02/13 02/01/13  Delbert PhenixLinda M Barefoot, NP   BP 112/86  Pulse 66  Temp(Src) 98.2 F (36.8 C) (Oral)  Resp 18  Wt 216 lb (97.977 kg)  SpO2 99% Physical Exam Nursing notes and Vital Signs reviewed. Appearance:  Patient appears stated age, and in no acute distress.  Patient is obese. Eyes:  Pupils are equal, round, and reactive to light and accomodation.  Extraocular movement is intact.  Conjunctivae are not inflamed.  Fundi benign.  Mild photophobia present. Ears:  Canals normal.  Tympanic membranes normal.  Nose:   Normal turbinates.  No sinus tenderness.    Pharynx:  Normal Neck:  Supple.  No adenopathy Lungs:  Clear to auscultation.  Breath sounds are equal.  Heart:  Regular rate and rhythm without murmurs, rubs, or gallops.  Abdomen:  Nontender without masses or hepatosplenomegaly.  Bowel sounds are present.  No CVA or flank tenderness.  Extremities:  No edema.  No calf tenderness Skin:  No rash present.  Neurologic:  Cranial nerves 2 through 12 are normal.  Patellar elbow reflexes are normal.  Cerebellar function is intact (finger-to-nose and rapid alternating hand movement).  Gait and station are normal.     ED Course  Procedures  none      MDM   1. Migraine headache; recurrent    Toradol 60mg  plus phenergan 25mg  IM. May continue Toradol for 1 to 3 days as needed. Followup with  Family Doctor if not improved in 3 to 4 days.    Lattie HawStephen A Beese, MD 08/28/13 1004

## 2013-08-20 NOTE — Discharge Instructions (Signed)
May continue Toradol for 1 to 3 days as needed.

## 2013-08-20 NOTE — ED Notes (Signed)
Pt c/o HA x 1 day.

## 2013-10-23 ENCOUNTER — Telehealth: Payer: Self-pay | Admitting: *Deleted

## 2013-10-23 DIAGNOSIS — G43919 Migraine, unspecified, intractable, without status migrainosus: Secondary | ICD-10-CM

## 2013-10-23 MED ORDER — NARATRIPTAN HCL 2.5 MG PO TABS: ORAL_TABLET | ORAL | Status: DC

## 2013-10-23 NOTE — Telephone Encounter (Signed)
[name] has advised patient to stay on the prednisone that her primary care has given her and we have called in an Amerge taper for her to begin.  She will call us back if her symptoms persist.

## 2013-10-23 NOTE — Telephone Encounter (Signed)
Patient called because she is having a migraine since last Thursday she has tried maxalt, ibuprofen, she has had a shot of torodol and a prescription for prednisone to treat a sinus infection and the headache does not go away for more than two hours.  Bonita QuinLinda is reviewing her chart and will advise me on what to tell the patient.

## 2013-10-30 ENCOUNTER — Ambulatory Visit (INDEPENDENT_AMBULATORY_CARE_PROVIDER_SITE_OTHER): Payer: 59 | Admitting: Nurse Practitioner

## 2013-10-30 ENCOUNTER — Encounter: Payer: Self-pay | Admitting: Nurse Practitioner

## 2013-10-30 VITALS — BP 118/70 | HR 82 | Resp 16 | Ht 65.0 in | Wt 212.0 lb

## 2013-10-30 DIAGNOSIS — G43919 Migraine, unspecified, intractable, without status migrainosus: Secondary | ICD-10-CM

## 2013-10-30 DIAGNOSIS — G43011 Migraine without aura, intractable, with status migrainosus: Secondary | ICD-10-CM

## 2013-10-30 MED ORDER — TOPIRAMATE ER 50 MG PO CAP24: 50.0000 mg | ORAL_CAPSULE | Freq: Every day | ORAL | Status: DC

## 2013-10-30 NOTE — Patient Instructions (Signed)

## 2013-10-30 NOTE — Progress Notes (Signed)
History:  [name] is a 28 y.o. [id number] who [facility] for migraine that has been ongoing for 12 days. She was seen by PCP who put her on Prednisone taper 40 , 30, 30, 20,20, 10 mg. She is still on taper. She called office and we advised stay on prednisone and add Amerge taper. She is off her Lyrica due to daytime drowsiness. Her Botox is due now but we do not have refill. Her pain now comes and goes.  The following portions of the patient's history were reviewed and updated as appropriate: allergies, current medications, past family history, past medical history, past social history, past surgical history and problem list.  Review of Systems:  Pertinent items are noted in HPI.  Objective:  Physical Exam BP 118/70  Pulse 82  Resp 16  Ht 5\' 5"  (1.651 m)  Wt 212 lb (96.163 kg)  BMI 35.28 kg/m2 GENERAL: Well-developed, well-nourished female in no acute distress.  HEENT: Normocephalic, atraumatic.  NECK: Muscles are tight in traps, temples  EXTREMITIES: No cyanosis, clubbing, or edema, 2+ distal pulses.  Trigger Point Injections Procedure: 4cc lidocaine, 4 cc marcaine, 2 cc dexamethazone. Injected with 1cc each site in Bil Occipital, Bil Temple and bilateral traps. Pt tolerated procedure well. Advised to ice tonight   Labs and Imaging No results found.  Assessment & Plan:  Assessment:  Status Migraine/ ongoing 12 days  Plans:  Trigger point Injections today with good relief Start Trokendi 25 mg then up to 50 mg Stay on prednisone taper Get Botox asap Fill out FMLA  Delbert PhenixLinda M Lashae Wollenberg, NP 10/30/2013 4:03 PM

## 2013-10-31 ENCOUNTER — Encounter: Payer: Self-pay | Admitting: *Deleted

## 2013-11-29 ENCOUNTER — Other Ambulatory Visit: Payer: Self-pay | Admitting: *Deleted

## 2013-11-29 DIAGNOSIS — G43111 Migraine with aura, intractable, with status migrainosus: Secondary | ICD-10-CM

## 2013-11-29 DIAGNOSIS — G43919 Migraine, unspecified, intractable, without status migrainosus: Secondary | ICD-10-CM

## 2013-11-29 MED ORDER — TOPIRAMATE ER 50 MG PO CAP24: 50.0000 mg | ORAL_CAPSULE | Freq: Every day | ORAL | Status: DC

## 2013-11-29 NOTE — Telephone Encounter (Signed)
RF on Trokendi XR 50 mg.  Pt had only been taking the 25 mg samples.

## 2013-12-13 ENCOUNTER — Other Ambulatory Visit: Payer: Self-pay | Admitting: Nurse Practitioner

## 2013-12-13 DIAGNOSIS — G43009 Migraine without aura, not intractable, without status migrainosus: Secondary | ICD-10-CM

## 2013-12-25 ENCOUNTER — Ambulatory Visit (INDEPENDENT_AMBULATORY_CARE_PROVIDER_SITE_OTHER): Payer: 59 | Admitting: Nurse Practitioner

## 2013-12-25 ENCOUNTER — Encounter: Payer: Self-pay | Admitting: Nurse Practitioner

## 2013-12-25 VITALS — BP 98/76 | HR 78 | Resp 16

## 2013-12-25 DIAGNOSIS — M62838 Other muscle spasm: Secondary | ICD-10-CM

## 2013-12-25 DIAGNOSIS — G43009 Migraine without aura, not intractable, without status migrainosus: Secondary | ICD-10-CM

## 2013-12-25 NOTE — Patient Instructions (Signed)

## 2013-12-25 NOTE — Progress Notes (Signed)
S: Pt in office today for Botox injections. She is using 100 units as needed. Her authorization ran out and she has been without and noticed a difference. She is up to trokendi 50 mg and that is helping. 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 She is hoping fall will be better.    O: Well developed, well nourished caucasian female NAD. Muscles in jaw are very tight. Temples are tender. Shoulders are tight  Botox Procedure Note/ Pt brought her own Botox Lot # [id number] Expiration Date April 2017  Botox Dosing by [employer] for Chronic Migraine   Injection Sites for Migraines  Botox 100 units was injected using the dosage in the table above in the pattern shown above.  A: Migraine  Muscle spasm   P: Botox 100 units injected today.  Increase Trokendi to 100 mg daily RTC 2 Months.

## 2014-01-09 NOTE — Telephone Encounter (Signed)
Pt called in stated she is out of the medication samples of Trokendi that she was given at last office visit and needs a refill. Per last office note from Jannifer Rodney, NP "Increase Trokendi to 100 mg daily", I will send in script to pt pharm. There was a pending refill for Soma in system but pt states she already rcvd that script and only needs the Trokendi refilled. I told her I will go ahead and send in script per last office note and she would be able to pick up at pharmacy. Pt expressed understanding.

## 2014-01-10 MED ORDER — TOPIRAMATE ER 100 MG PO CAP24: 100.0000 mg | ORAL_CAPSULE | Freq: Every day | ORAL | Status: DC

## 2014-02-04 ENCOUNTER — Other Ambulatory Visit: Payer: Self-pay | Admitting: Nurse Practitioner

## 2014-02-25 ENCOUNTER — Encounter: Payer: Self-pay | Admitting: Nurse Practitioner

## 2014-02-26 ENCOUNTER — Encounter: Payer: 59 | Admitting: Nurse Practitioner

## 2014-04-30 ENCOUNTER — Ambulatory Visit (INDEPENDENT_AMBULATORY_CARE_PROVIDER_SITE_OTHER): Payer: 59 | Admitting: Nurse Practitioner

## 2014-04-30 ENCOUNTER — Encounter: Payer: Self-pay | Admitting: Nurse Practitioner

## 2014-04-30 VITALS — BP 100/70 | HR 82 | Resp 16 | Ht 64.0 in | Wt 215.0 lb

## 2014-04-30 DIAGNOSIS — G43009 Migraine without aura, not intractable, without status migrainosus: Secondary | ICD-10-CM

## 2014-04-30 DIAGNOSIS — M62838 Other muscle spasm: Secondary | ICD-10-CM

## 2014-04-30 MED ORDER — PREGABALIN 75 MG PO CAPS: 75.0000 mg | ORAL_CAPSULE | Freq: Two times a day (BID) | ORAL | Status: DC

## 2014-04-30 NOTE — Progress Notes (Signed)
S: Pt in office today for Botox injections. Her last Botox was in September. She missed her appointment date due to sick child. She is using 100 units as needed. She feels she has been worse lately and would like to retrial Lyrica which she has used in the past. Her insurance would not pay for trokendi. She has restarted her menses and that is also a factor. She has nexplanon and had about one year without menses. She has been having a lot of pains in her right temple and would like trigger point injection there. She has migraine now. She does not want Botox in her forehead   O: Alert, oriented, has migraine Temples and neck are tight and tender  Trigger Point Injection/ right temple only/ 5cc Procedure: 2cc lidocaine, 2cc marcaine, 1 cc dexamethazone. Injected right temple with 3 cc and left scapula region with 2cc. Pt tolerated procedure well. Advised ice.   Botox Procedure Note/ Used from office supply Lot # [id number] Expiration Date Jan 2017  Botox Dosing by [employer] for Chronic Migraine   Injection Sites for Migraines/ patient declines Botox in Frontalis and Corrugator / will add that to neck and back  Botox 100 units was injected using the dosage in the table above in the pattern shown above.  A: Migraine  Muscle spasm   P: Botox 100 units injected today.  Lyrica 75 mg BID Discussed sleep issues, exercise, alone time RTC 2 Months.

## 2014-04-30 NOTE — Patient Instructions (Signed)

## 2014-06-25 ENCOUNTER — Encounter: Payer: Self-pay | Admitting: Nurse Practitioner

## 2014-06-25 ENCOUNTER — Ambulatory Visit (INDEPENDENT_AMBULATORY_CARE_PROVIDER_SITE_OTHER): Payer: 59 | Admitting: Nurse Practitioner

## 2014-06-25 VITALS — BP 104/70 | HR 72 | Resp 16 | Ht 64.0 in | Wt 217.0 lb

## 2014-06-25 DIAGNOSIS — E669 Obesity, unspecified: Secondary | ICD-10-CM

## 2014-06-25 DIAGNOSIS — M62838 Other muscle spasm: Secondary | ICD-10-CM

## 2014-06-25 DIAGNOSIS — G43009 Migraine without aura, not intractable, without status migrainosus: Secondary | ICD-10-CM | POA: Diagnosis not present

## 2014-06-25 MED ORDER — RIZATRIPTAN BENZOATE 10 MG PO TABS: 10.0000 mg | ORAL_TABLET | ORAL | Status: DC | PRN

## 2014-06-25 NOTE — Progress Notes (Signed)
S: Pt in office today for Botox injections. Her Botox has been working very well for migraine prevention. She is using 100units as needed. Since our last visit she feels she is definetly doing better . She had 22/30 days with headache. Severe 2 moderate 10 mild 10. She does not want trigger point injection today. She does not want Botox in forehead. Her worst side is right side. She feels her Lyrica is helping mostly because it is helping her sleep.   O: Alert, oriented, NAD Temples, traps are more tender on right side  Botox Procedure Note/ 1 Vial Botox taken from Office supply Lot # [id number] Expiration Date Jan 2017  Botox Dosing by [facility] for Chronic Migraine   Injection Sites for Migraines/ not used in diagram 1/ not in forehead  Botox 100 units was injected using the dosage in the table above in the pattern shown above.  A: Migraine  Muscle spasm   P: Botox 100 units injected today.  Refilled Maxalt for one year RTC 3 Months.

## 2014-06-25 NOTE — Patient Instructions (Signed)

## 2014-08-13 ENCOUNTER — Emergency Department
Admission: EM | Admit: 2014-08-13 | Discharge: 2014-08-13 | Disposition: A | Discharge status: Home / Self Care | Payer: 59 | Source: Home / Self Care | Attending: Emergency Medicine | Admitting: Emergency Medicine

## 2014-08-13 ENCOUNTER — Encounter: Payer: Self-pay | Admitting: Emergency Medicine

## 2014-08-13 DIAGNOSIS — G43109 Migraine with aura, not intractable, without status migrainosus: Secondary | ICD-10-CM | POA: Diagnosis not present

## 2014-08-13 MED ORDER — KETOROLAC TROMETHAMINE 60 MG/2ML IM SOLN
60.0000 mg | Freq: Once | INTRAMUSCULAR | Status: AC
  Administered 2014-08-13: 60 mg via INTRAMUSCULAR

## 2014-08-13 MED ORDER — KETOROLAC TROMETHAMINE 60 MG/2ML IM SOLN: 60.0000 mg | Freq: Once | INTRAMUSCULAR | Status: DC

## 2014-08-13 NOTE — Discharge Instructions (Signed)

## 2014-08-13 NOTE — ED Notes (Signed)
Patient presents with a Migraine Headache times 3 days. Rates pain 6/10. History of the same.

## 2014-08-14 NOTE — ED Provider Notes (Signed)
CSN: [id number]     Arrival date & time 08/13/14  1933 History   First MD Initiated Contact with Patient 08/13/14 1953     Chief Complaint  Patient presents with  . Migraine   (Consider location/radiation/quality/duration/timing/severity/associated sxs/prior Treatment) Patient is a 29 y.o. female presenting with migraines. The history is provided by the patient. No language interpreter was used.  Migraine This is a new problem. The current episode started more than 2 days ago. The problem occurs constantly. The problem has been gradually worsening. Associated symptoms include headaches. Nothing aggravates the symptoms. Nothing relieves the symptoms. The treatment provided no relief.  Pt complains of a headache.   Pt reports she has migraines.   Pt reports this is her typical headache.  Pt reports she usually get torodol injections with good relief  Past Medical History  Diagnosis Date  . Asthma   . Migraines   . GERD (gastroesophageal reflux disease)   . Obesity   . Shingles    Past Surgical History  Procedure Laterality Date  . Knee surgeries    . Cholecystectomy     Family History  Problem Relation Age of Onset  . Migraines Other   . Bipolar disorder Mother   . Diabetes Other   . Heart failure Other   . Heart attack Other   . Diabetes Other     type 1  . Heart disease Other   . Heart attack Other   . Heart disease Paternal Grandmother   . Heart disease Paternal Grandfather    History  Substance Use Topics  . Smoking status: Never Smoker   . Smokeless tobacco: Not on file  . Alcohol Use: No   OB History    Gravida Para Term Preterm AB TAB SAB Ectopic Multiple Living   2 2 1 1  0 0 0 0 0 2     Review of Systems  Neurological: Positive for headaches.  All other systems reviewed and are negative.   Allergies  Codeine; Dilaudid; Morphine and related; Sudafed; and Tramadol  Home Medications   Prior to Admission medications   Medication Sig Start Date End Date  Taking? Authorizing Provider  acetaminophen (TYLENOL) 325 MG tablet Take 650 mg by mouth every 6 (six) hours as needed.    Historical Provider, MD  albuterol (PROVENTIL HFA;VENTOLIN HFA) 108 (90 BASE) MCG/ACT inhaler Inhale 2 puffs into the lungs every 4 (four) hours as needed. 03/17/13   Lattie HawStephen A Beese, MD  carisoprodol (SOMA) 350 MG tablet TAKE ONE TABLET BY MOUTH TWICE DAILY AS NEEDED FOR MUSCLE SPASMS  01/10/14   Delbert PhenixLinda M Barefoot, NP  doxycycline (VIBRAMYCIN) 100 MG capsule  04/23/14   Historical Provider, MD  esomeprazole (NEXIUM) 20 MG capsule Take 20 mg by mouth daily before breakfast.      Historical Provider, MD  ibuprofen (ADVIL,MOTRIN) 200 MG tablet Take 200 mg by mouth every 6 (six) hours as needed.    Historical Provider, MD  naproxen sodium (ANAPROX) 550 MG tablet Take 1 tablet (550 mg total) by mouth 2 (two) times daily with a meal. 01/02/13   Delbert PhenixLinda M Barefoot, NP  naratriptan (AMERGE) 2.5 MG tablet  10/23/13   Historical Provider, MD  ondansetron (ZOFRAN) 4 MG tablet Take 1 tablet (4 mg total) by mouth every 6 (six) hours. as needed for nausea 10/03/12   Lattie HawStephen A Beese, MD  pregabalin (LYRICA) 75 MG capsule Take 1 capsule (75 mg total) by mouth 2 (two) times daily. 04/30/14  Delbert Phenix, NP  promethazine (PHENERGAN) 25 MG tablet Take 1 tablet (25 mg total) by mouth every 6 (six) hours as needed for nausea. 06/05/13   Delbert Phenix, NP  rizatriptan (MAXALT) 10 MG tablet Take 1 tablet (10 mg total) by mouth as needed for migraine. May repeat in 2 hours if needed 06/25/14   Delbert Phenix, NP  Topiramate ER (TROKENDI XR) 100 MG CP24 Take 100 mg by mouth daily. 01/10/14   Delbert Phenix, NP  zolpidem (AMBIEN) 10 MG tablet Take 1 tablet (10 mg total) by mouth at bedtime as needed for sleep. 01/02/13 02/01/13  Delbert Phenix, NP   BP 109/77 mmHg  Pulse 77  Temp(Src) 98.2 F (36.8 C) (Oral)  Ht  (1.626 m)  Wt 220 lb (99.791 kg)  BMI 37.74 kg/m2  SpO2 99% Physical Exam   Constitutional: She is oriented to person, place, and time. She appears well-developed and well-nourished.  HENT:  Head: Normocephalic and atraumatic.  Right Ear: External ear normal.  Left Ear: External ear normal.  Nose: Nose normal.  Mouth/Throat: Oropharynx is clear and moist.  Eyes: Conjunctivae and EOM are normal. Pupils are equal, round, and reactive to light.  Neck: Normal range of motion. Neck supple.  Cardiovascular: Normal rate, regular rhythm and normal heart sounds.   Pulmonary/Chest: Effort normal and breath sounds normal.  Abdominal: Soft.  Musculoskeletal: Normal range of motion.  Neurological: She is alert and oriented to person, place, and time. She has normal reflexes.  Skin: Skin is warm.  Psychiatric: She has a normal mood and affect.  Nursing note and vitals reviewed.   ED Course  Procedures (including critical care time) Labs Review Labs Reviewed - No data to display  Imaging Review No results found.   MDM   1. Migraine with aura and without status migrainosus, not intractable    Pt given torodol injection.  Pt reports the beginning of relief. Return if any problems. AVS    Elson Areas, PA-C 08/14/14 1327

## 2014-10-17 ENCOUNTER — Telehealth: Payer: Self-pay

## 2014-10-17 NOTE — Telephone Encounter (Signed)
Left message for [name] to discuss her bill and to explain what is the next step to do so we can help her get reimbursed.

## 2015-01-02 ENCOUNTER — Emergency Department
Admission: EM | Admit: 2015-01-02 | Discharge: 2015-01-02 | Disposition: A | Discharge status: Home / Self Care | Payer: 59 | Source: Home / Self Care | Attending: Family Medicine | Admitting: Family Medicine

## 2015-01-02 ENCOUNTER — Encounter: Payer: Self-pay | Admitting: Emergency Medicine

## 2015-01-02 DIAGNOSIS — G43909 Migraine, unspecified, not intractable, without status migrainosus: Secondary | ICD-10-CM

## 2015-01-02 MED ORDER — KETOROLAC TROMETHAMINE 30 MG/ML IJ SOLN
60.0000 mg | Freq: Once | INTRAMUSCULAR | Status: AC
  Administered 2015-01-02: 60 mg via INTRAMUSCULAR

## 2015-01-02 MED ORDER — METOCLOPRAMIDE HCL 5 MG/ML IJ SOLN
5.0000 mg | Freq: Once | INTRAMUSCULAR | Status: AC
  Administered 2015-01-02: 5 mg via INTRAMUSCULAR

## 2015-01-02 MED ORDER — DEXAMETHASONE SODIUM PHOSPHATE 10 MG/ML IJ SOLN
10.0000 mg | Freq: Once | INTRAMUSCULAR | Status: AC
  Administered 2015-01-02: 10 mg via INTRAMUSCULAR

## 2015-01-02 NOTE — ED Notes (Addendum)
Facial pain, pressure, sinus pain and pressure, migraine x 4 days, nausea, dizziness.

## 2015-01-02 NOTE — ED Provider Notes (Signed)
CSN: [id number]     Arrival date & time 01/02/15  0808 History   First MD Initiated Contact with Patient 01/02/15 0827     Chief Complaint  Patient presents with  . Facial Pain     HPI Comments: Patient developed a typical right side migraine headache 3 days ago which did not respond to Maxalt and her usual medications.  She complains of persistent nausea and right sinus facial pain.  She states that her usual migraines always cause increased sinus congestion on the right.  (note sinus films done two years ago during similar headache were normal).  No fevers, chills, and sweats.  No localizing neurologic symptoms. She states that her migraines have occurred less frequently since her neurologist prescribed Lyrica and nortriptyline.  The history is provided by the patient.    Past Medical History  Diagnosis Date  . Asthma   . Migraines   . GERD (gastroesophageal reflux disease)   . Obesity   . Shingles    Past Surgical History  Procedure Laterality Date  . Knee surgeries    . Cholecystectomy     Family History  Problem Relation Age of Onset  . Migraines Other   . Bipolar disorder Mother   . Diabetes Other   . Heart failure Other   . Heart attack Other   . Diabetes Other     type 1  . Heart disease Other   . Heart attack Other   . Heart disease Paternal Grandmother   . Heart disease Paternal Grandfather    Social History  Substance Use Topics  . Smoking status: Never Smoker   . Smokeless tobacco: None  . Alcohol Use: No   OB History    Gravida Para Term Preterm AB TAB SAB Ectopic Multiple Living   2 2 1 1  0 0 0 0 0 2     Review of Systems  Allergies  Codeine; Dilaudid; Morphine and related; Sudafed; and Tramadol  Home Medications   Prior to Admission medications   Medication Sig Start Date End Date Taking? Authorizing Provider  acetaminophen (TYLENOL) 325 MG tablet Take 650 mg by mouth every 6 (six) hours as needed.    Historical Provider, MD  albuterol  (PROVENTIL HFA;VENTOLIN HFA) 108 (90 BASE) MCG/ACT inhaler Inhale 2 puffs into the lungs every 4 (four) hours as needed. 03/17/13   [name], MD  carisoprodol (SOMA) 350 MG tablet TAKE ONE TABLET BY MOUTH TWICE DAILY AS NEEDED FOR MUSCLE SPASMS  01/10/14   Delbert Phenix, NP  doxycycline (VIBRAMYCIN) 100 MG capsule  04/23/14   Historical Provider, MD  esomeprazole (NEXIUM) 20 MG capsule Take 20 mg by mouth daily before breakfast.      Historical Provider, MD  ibuprofen (ADVIL,MOTRIN) 200 MG tablet Take 200 mg by mouth every 6 (six) hours as needed.    Historical Provider, MD  naproxen sodium (ANAPROX) 550 MG tablet Take 1 tablet (550 mg total) by mouth 2 (two) times daily with a meal. 01/02/13   Delbert Phenix, NP  naratriptan (AMERGE) 2.5 MG tablet  10/23/13   Historical Provider, MD  ondansetron (ZOFRAN) 4 MG tablet Take 1 tablet (4 mg total) by mouth every 6 (six) hours. as needed for nausea 10/03/12   [name], MD  pregabalin (LYRICA) 75 MG capsule Take 1 capsule (75 mg total) by mouth 2 (two) times daily. 04/30/14   Delbert Phenix, NP  promethazine (PHENERGAN) 25 MG tablet Take 1 tablet (25  mg total) by mouth every 6 (six) hours as needed for nausea. 06/05/13   Delbert Phenix, NP  rizatriptan (MAXALT) 10 MG tablet Take 1 tablet (10 mg total) by mouth as needed for migraine. May repeat in 2 hours if needed 06/25/14   Delbert Phenix, NP  Topiramate ER (TROKENDI XR) 100 MG CP24 Take 100 mg by mouth daily. 01/10/14   Delbert Phenix, NP  zolpidem (AMBIEN) 10 MG tablet Take 1 tablet (10 mg total) by mouth at bedtime as needed for sleep. 01/02/13 02/01/13  Delbert Phenix, NP   Meds Ordered and Administered this Visit  Medications - No data to display  BP 110/79 mmHg  Pulse 103  Temp(Src) 98.5 F (36.9 C) (Oral)  Ht  (1.626 m)  Wt 215 lb (97.523 kg)  BMI 36.89 kg/m2  SpO2 98% No data found.   Physical Exam Nursing notes and Vital Signs reviewed. Appearance:  Patient  appears stated age, and in no acute distress Eyes:  Pupils are equal, round, and reactive to light and accomodation.  Extraocular movement is intact.  Conjunctivae are not inflamed.  Fundi benign.  Mild photophobia present.  Ears:  Canals normal.  Tympanic membranes normal.  Nose:  Normal turbinates.  No sinus tenderness.   Pharynx:  Normal Neck:  Supple.  No adenopathy or thyromegaly Lungs:  Clear to auscultation.  Breath sounds are equal.  Moving air well. Heart:  Regular rate and rhythm without murmurs, rubs, or gallops.  Extremities:  No edema.    Skin:  No rash present.   Neurologic:  Cranial nerves 2 through 12 are normal.      ED Course  Procedures  None    MDM   1. Migraine without status migrainosus, not intractable, unspecified migraine type    Toradol  IM.  Decadron  IM.  Reglan  IM Rest today.  May continue Zofran as needed for nausea. Continue present migraine medications (Lyrica and nortriptyline).  Proceed to ER if not improved 48 hours    [name], MD 01/02/15 [phone]

## 2015-01-02 NOTE — Discharge Instructions (Signed)
Rest today.  May continue Zofran as needed for nausea.

## 2015-01-05 ENCOUNTER — Telehealth: Payer: Self-pay | Admitting: Emergency Medicine

## 2015-01-05 NOTE — ED Notes (Signed)
Inquired about patient's status; encourage them to call with questions/concerns.  

## 2015-03-25 ENCOUNTER — Emergency Department
Admission: EM | Admit: 2015-03-25 | Discharge: 2015-03-25 | Disposition: A | Discharge status: Home / Self Care | Payer: 59 | Source: Home / Self Care | Attending: Family Medicine | Admitting: Family Medicine

## 2015-03-25 ENCOUNTER — Encounter: Payer: Self-pay | Admitting: *Deleted

## 2015-03-25 DIAGNOSIS — G43809 Other migraine, not intractable, without status migrainosus: Secondary | ICD-10-CM

## 2015-03-25 DIAGNOSIS — H6502 Acute serous otitis media, left ear: Secondary | ICD-10-CM | POA: Diagnosis not present

## 2015-03-25 LAB — POCT RAPID STREP A (OFFICE): Rapid Strep A Screen: NEGATIVE

## 2015-03-25 MED ORDER — AMOXICILLIN-POT CLAVULANATE 875-125 MG PO TABS: 1.0000 | ORAL_TABLET | Freq: Two times a day (BID) | ORAL | Status: DC

## 2015-03-25 MED ORDER — METOCLOPRAMIDE HCL 5 MG/ML IJ SOLN
10.0000 mg | Freq: Once | INTRAMUSCULAR | Status: AC
  Administered 2015-03-25: 10 mg via INTRAMUSCULAR

## 2015-03-25 MED ORDER — DEXAMETHASONE SODIUM PHOSPHATE 10 MG/ML IJ SOLN
10.0000 mg | Freq: Once | INTRAMUSCULAR | Status: AC
  Administered 2015-03-25: 10 mg via INTRAMUSCULAR

## 2015-03-25 MED ORDER — PREDNISONE 20 MG PO TABS: ORAL_TABLET | ORAL | Status: DC

## 2015-03-25 MED ORDER — KETOROLAC TROMETHAMINE 60 MG/2ML IM SOLN
60.0000 mg | Freq: Once | INTRAMUSCULAR | Status: AC
  Administered 2015-03-25: 60 mg via INTRAMUSCULAR

## 2015-03-25 NOTE — ED Provider Notes (Signed)
CSN: [id number]     Arrival date & time 03/25/15  1559 History   First MD Initiated Contact with Patient 03/25/15 1607     Chief Complaint  Patient presents with  . Headache   (Consider location/radiation/quality/duration/timing/severity/associated sxs/prior Treatment) Patient is a 29 y.o. female presenting with headaches.  Headache Associated symptoms: congestion, ear pain, eye pain, myalgias, nausea, photophobia, sore throat and vomiting   Associated symptoms: no abdominal pain, no diarrhea, no dizziness and no fever   Pt is a 29yo female with hx of migraines presenting to [facility] with c/o frontal headache with associated nasal congestion and sinus pressure for 6 days.  Pt also reports mild intermittent sore throat with exposure to co-workers dx with strep throat over the past week.  Headache is aching and throbbing, more severe on Right side. Headache was gradual in onset, similar to previous migraines but it did cause an episode of nausea and vomiting yesterday due to the severe pain.  She has been taking her Maxalt and Topiramate with only temporary relief.  Pain is 4/10 at this time.  Pt also c/o bilateral ear pressure.  No recent head injuries. No fever or chills. No neck pain or stiffness.  Past Medical History  Diagnosis Date  . Asthma   . Migraines   . GERD (gastroesophageal reflux disease)   . Obesity   . Shingles    Past Surgical History  Procedure Laterality Date  . Knee surgeries    . Cholecystectomy     Family History  Problem Relation Age of Onset  . Migraines Other   . Bipolar disorder Mother   . Diabetes Other   . Heart failure Other   . Heart attack Other   . Diabetes Other     type 1  . Heart disease Other   . Heart attack Other   . Heart disease Paternal Grandmother   . Heart disease Paternal Grandfather    Social History  Substance Use Topics  . Smoking status: Never Smoker   . Smokeless tobacco: None  . Alcohol Use: No   OB History    Gravida Para  Term Preterm AB TAB SAB Ectopic Multiple Living   0 0 0 0 0 2     Review of Systems  Constitutional: Negative for fever and chills.  HENT: Positive for congestion, ear pain and sore throat. Negative for trouble swallowing and voice change.   Eyes: Positive for photophobia, pain and visual disturbance. Negative for redness and itching.  Gastrointestinal: Positive for nausea and vomiting. Negative for abdominal pain, diarrhea and constipation.  Musculoskeletal: Positive for myalgias and arthralgias.  Neurological: Positive for headaches. Negative for dizziness and light-headedness.    Allergies  Codeine; Dilaudid; Morphine and related; Sudafed; and Tramadol  Home Medications   Prior to Admission medications   Medication Sig Start Date End Date Taking? Authorizing Provider  acetaminophen (TYLENOL) 325 MG tablet Take 650 mg by mouth every 6 (six) hours as needed.    Historical Provider, MD  albuterol (PROVENTIL HFA;VENTOLIN HFA) 108 (90 BASE) MCG/ACT inhaler Inhale 2 puffs into the lungs every 4 (four) hours as needed. 03/17/13   [name], MD  amoxicillin-clavulanate (AUGMENTIN) 875-125 MG tablet Take 1 tablet by mouth 2 (two) times daily. One po bid x 7 days 03/25/15   Junius Finner, PA-C  carisoprodol (SOMA) 350 MG tablet TAKE ONE TABLET BY MOUTH TWICE DAILY AS NEEDED FOR MUSCLE SPASMS  01/10/14   Delbert Phenix,  NP  esomeprazole (NEXIUM) 20 MG capsule Take 20 mg by mouth daily before breakfast.      Historical Provider, MD  ibuprofen (ADVIL,MOTRIN) 200 MG tablet Take 200 mg by mouth every 6 (six) hours as needed.    Historical Provider, MD  naproxen sodium (ANAPROX) 550 MG tablet Take 1 tablet (550 mg total) by mouth 2 (two) times daily with a meal. 01/02/13   Delbert Phenix, NP  naratriptan (AMERGE) 2.5 MG tablet  10/23/13   Historical Provider, MD  ondansetron (ZOFRAN) 4 MG tablet Take 1 tablet (4 mg total) by mouth every 6 (six) hours. as needed for nausea 10/03/12    [name], MD  predniSONE (DELTASONE) 20 MG tablet 3 tabs po day one, then 2 po daily x 4 days 03/25/15   Junius Finner, PA-C  pregabalin (LYRICA) 75 MG capsule Take 1 capsule (75 mg total) by mouth 2 (two) times daily. 04/30/14   Delbert Phenix, NP  promethazine (PHENERGAN) 25 MG tablet Take 1 tablet (25 mg total) by mouth every 6 (six) hours as needed for nausea. 06/05/13   Delbert Phenix, NP  rizatriptan (MAXALT) 10 MG tablet Take 1 tablet (10 mg total) by mouth as needed for migraine. May repeat in 2 hours if needed 06/25/14   Delbert Phenix, NP  Topiramate ER (TROKENDI XR) 100 MG CP24 Take 100 mg by mouth daily. 01/10/14   Delbert Phenix, NP  zolpidem (AMBIEN) 10 MG tablet Take 1 tablet (10 mg total) by mouth at bedtime as needed for sleep. 01/02/13 02/01/13  Delbert Phenix, NP   Meds Ordered and Administered this Visit   Medications  metoCLOPramide (REGLAN) injection 10 mg (10 mg Intramuscular Given 03/25/15 1645)  dexamethasone (DECADRON) injection 10 mg (10 mg Intramuscular Given 03/25/15 1645)  ketorolac (TORADOL) injection 60 mg (60 mg Intramuscular Given 03/25/15 1645)    BP 133/87 mmHg  Pulse 103  Temp(Src) 98.2 F (36.8 C) (Oral)  Resp 18  Ht  (1.651 m)  Wt 212 lb (96.163 kg)  BMI 35.28 kg/m2  SpO2 100%  LMP 03/11/2015 No data found.   Physical Exam  Constitutional: She is oriented to person, place, and time. She appears well-developed and well-nourished. No distress.  HENT:  Head: Normocephalic and atraumatic.  Right Ear: Hearing, external ear and ear canal normal. A middle ear effusion is present.  Left Ear: Hearing, external ear and ear canal normal. Tympanic membrane is erythematous. Tympanic membrane is not bulging. A middle ear effusion is present.  Nose: Mucosal edema present. Right sinus exhibits maxillary sinus tenderness. Right sinus exhibits no frontal sinus tenderness. Left sinus exhibits no maxillary sinus tenderness and no frontal sinus  tenderness.  Mouth/Throat: Uvula is midline and mucous membranes are normal. Posterior oropharyngeal erythema present.  Eyes: Conjunctivae are normal. No scleral icterus.  Neck: Normal range of motion.  Cardiovascular: Normal rate, regular rhythm and normal heart sounds.   Pulmonary/Chest: Effort normal and breath sounds normal. No respiratory distress. She has no wheezes. She has no rales. She exhibits no tenderness.  Abdominal: Soft. She exhibits no distension and no mass. There is no tenderness. There is no rebound and no guarding.  Musculoskeletal: Normal range of motion.  Neurological: She is alert and oriented to person, place, and time. She has normal strength. No cranial nerve deficit or sensory deficit. She displays a negative Romberg sign. Coordination and gait normal. GCS eye subscore is 4. GCS verbal subscore is 5. GCS  motor subscore is 6.  Skin: Skin is warm and dry. She is not diaphoretic.  Nursing note and vitals reviewed.   ED Course  Procedures (including critical care time)  Labs Review Labs Reviewed  POCT RAPID STREP A (OFFICE)    Imaging Review No results found.    MDM   1. Acute serous otitis media of left ear, recurrence not specified   2. Other migraine without status migrainosus, not intractable     Pt with hx of migraines [facility] with c/o headache similar to prior migraines, however, associated nasal congestion and sore throat, exposure to strep throat.  Normal neuro exam Rapid strep: negative Exam c/w Left AOM Pt also requesting tx for migraine in UC Tx in UC- toradol 60mg  IM, decadron 10mg  IM, and reglan 10mg  IM Rx: augmentin and prednisone Encouraged fluids and rest. Pt uses Flonase at home, encouraged to keep using.  F/u with PCP in 1 week if not improving, sooner if worsening. Patient verbalized understanding and agreement with treatment plan.    Junius FinnerErin O'Malley, PA-C 03/25/15 1735

## 2015-03-25 NOTE — Discharge Instructions (Signed)

## 2015-03-25 NOTE — ED Notes (Signed)
Pt c/o HA, sinus pain/pressure, and nasal congestion x 6 days. Denies fever. She reports coworker with strep and request a strep test.

## 2015-05-05 ENCOUNTER — Encounter: Payer: Self-pay | Admitting: Obstetrics & Gynecology

## 2015-05-05 ENCOUNTER — Ambulatory Visit (INDEPENDENT_AMBULATORY_CARE_PROVIDER_SITE_OTHER): Payer: 59 | Admitting: Obstetrics & Gynecology

## 2015-05-05 VITALS — BP 98/70 | HR 72 | Resp 16 | Ht 64.0 in | Wt 218.0 lb

## 2015-05-05 DIAGNOSIS — M545 Low back pain, unspecified: Secondary | ICD-10-CM

## 2015-05-05 DIAGNOSIS — Z3046 Encounter for surveillance of implantable subdermal contraceptive: Secondary | ICD-10-CM | POA: Diagnosis not present

## 2015-05-05 NOTE — Progress Notes (Signed)
   Subjective:    Patient ID: [name], female    DOB: July 23, 1985, 30 y.o.   MRN: [id number]  HPI  30 yo lady is here for the removal of her 30 yo Nexplanon. She suffers with back pain and thinks that the Nexplanon may be the cause. She would like to have it out and see if her back pain improves. If not, she plans to get another Nexplanon inserted. She will use condoms prn.  Review of Systems She is now seeing a neurologist for her migraines and doing well with nortriptyline instead of Botox. She has a pap smear scheduled with her primary care md in March.    Objective:   Physical Exam  [id number] Breathing, conversing, and ambulating normally Consent was signed and time out was done. Her right arm was prepped with betadine after establishing the position of the Nexplanon. The area was infiltrated with 2 cc of 1% lidocaine. A small incision was made and the intact rod was easily removed. A steristrip was placed and her arm was noted to be hemostatic. It was bandaged.  She tolerated the procedure well.      Assessment & Plan:  Contraception- condoms prn

## 2015-05-26 ENCOUNTER — Other Ambulatory Visit: Payer: Self-pay | Admitting: Obstetrics and Gynecology

## 2015-05-26 ENCOUNTER — Other Ambulatory Visit (HOSPITAL_COMMUNITY)
Admission: RE | Admit: 2015-05-26 | Discharge: 2015-05-26 | Disposition: A | Discharge status: Home / Self Care | Payer: 59 | Source: Ambulatory Visit | Attending: Obstetrics and Gynecology | Admitting: Obstetrics and Gynecology

## 2015-05-26 DIAGNOSIS — Z01419 Encounter for gynecological examination (general) (routine) without abnormal findings: Secondary | ICD-10-CM | POA: Diagnosis present

## 2015-05-26 DIAGNOSIS — Z1151 Encounter for screening for human papillomavirus (HPV): Secondary | ICD-10-CM | POA: Insufficient documentation

## 2015-05-28 LAB — CYTOLOGY - PAP

## 2015-06-08 ENCOUNTER — Encounter: Payer: Self-pay | Admitting: Emergency Medicine

## 2015-06-08 ENCOUNTER — Emergency Department
Admission: EM | Admit: 2015-06-08 | Discharge: 2015-06-08 | Disposition: A | Discharge status: Home / Self Care | Payer: 59 | Source: Home / Self Care | Attending: Family Medicine | Admitting: Family Medicine

## 2015-06-08 DIAGNOSIS — J019 Acute sinusitis, unspecified: Secondary | ICD-10-CM | POA: Diagnosis not present

## 2015-06-08 DIAGNOSIS — G43009 Migraine without aura, not intractable, without status migrainosus: Secondary | ICD-10-CM | POA: Diagnosis not present

## 2015-06-08 MED ORDER — AMOXICILLIN-POT CLAVULANATE 875-125 MG PO TABS: 1.0000 | ORAL_TABLET | Freq: Two times a day (BID) | ORAL | Status: DC

## 2015-06-08 MED ORDER — METOCLOPRAMIDE HCL 5 MG/ML IJ SOLN
5.0000 mg | Freq: Once | INTRAMUSCULAR | Status: AC
  Administered 2015-06-08: 5 mg via INTRAMUSCULAR

## 2015-06-08 MED ORDER — KETOROLAC TROMETHAMINE 60 MG/2ML IM SOLN
60.0000 mg | Freq: Once | INTRAMUSCULAR | Status: AC
  Administered 2015-06-08: 60 mg via INTRAMUSCULAR

## 2015-06-08 MED ORDER — DEXAMETHASONE SODIUM PHOSPHATE 10 MG/ML IJ SOLN
10.0000 mg | Freq: Once | INTRAMUSCULAR | Status: AC
  Administered 2015-06-08: 10 mg via INTRAMUSCULAR

## 2015-06-08 NOTE — ED Notes (Signed)
Pt c/o headache, sinus pressure/pain x 6 days, nausea, vomitting, frontal pressure.

## 2015-06-08 NOTE — ED Provider Notes (Signed)
CSN: [id number]     Arrival date & time 06/08/15  1634 History   First MD Initiated Contact with Patient 06/08/15 1713     Chief Complaint  Patient presents with  . Headache   (Consider location/radiation/quality/duration/timing/severity/associated sxs/prior Treatment) HPI THe pt is a 30yo female with hx of migraines presenting to [facility] with c/o 6 days of generalized headache that is worse on Right side of head, associated sinus pressure and pain.  She did have 1 episode of vomiting the other day but is now just nauseated from the pressure and headache.  Pain is 6/10.  She has been taking her home medications for migraines, with only temporary relief.  She notes she has been under a lot of stress this week as she just found out she was positive for HSV1, HSV2, and HPV, and may have cervical cancer.  She is also concerned she has a sinus infection on top of her migraine as she has had nasal congestion too along with ear pressure.  Denies change in vision. Denies numbness or tingling in arms or legs. Denies head trauma.   Past Medical History  Diagnosis Date  . Asthma   . Migraines   . GERD (gastroesophageal reflux disease)   . Obesity   . Shingles    Past Surgical History  Procedure Laterality Date  . Knee surgeries    . Cholecystectomy     Family History  Problem Relation Age of Onset  . Migraines Other   . Bipolar disorder Mother   . Diabetes Other   . Heart failure Other   . Heart attack Other   . Diabetes Other     type 1  . Heart disease Other   . Heart attack Other   . Heart disease Paternal Grandmother   . Heart disease Paternal Grandfather    Social History  Substance Use Topics  . Smoking status: Never Smoker   . Smokeless tobacco: None  . Alcohol Use: No   OB History    Gravida Para Term Preterm AB TAB SAB Ectopic Multiple Living   0 0 0 0 0 2     Review of Systems  Constitutional: Negative for fever and chills.  HENT: Positive for congestion, ear  pain, postnasal drip and sinus pressure. Negative for rhinorrhea, sore throat, trouble swallowing and voice change.   Eyes: Positive for photophobia. Negative for pain and visual disturbance.  Respiratory: Negative for cough and shortness of breath.   Cardiovascular: Negative for chest pain and palpitations.  Gastrointestinal: Positive for nausea and vomiting. Negative for abdominal pain and diarrhea.  Musculoskeletal: Negative for myalgias, back pain and arthralgias.  Skin: Negative for rash.  Neurological: Positive for headaches. Negative for dizziness and light-headedness.    Allergies  Codeine; Dilaudid; Morphine and related; Sudafed; and Tramadol  Home Medications   Prior to Admission medications   Medication Sig Start Date End Date Taking? Authorizing Provider  amoxicillin-clavulanate (AUGMENTIN) 875-125 MG tablet Take 1 tablet by mouth 2 (two) times daily. One po bid x 7 days 06/08/15   Junius Finner, PA-C  nortriptyline (PAMELOR) 25 MG capsule Take 50 mg by mouth. 01/22/15   Historical Provider, MD  pregabalin (LYRICA) 75 MG capsule Take 75 mg by mouth. 01/22/15   Historical Provider, MD   Meds Ordered and Administered this Visit   Medications  ketorolac (TORADOL) injection 60 mg (60 mg Intramuscular Given 06/08/15 1743)  metoCLOPramide (REGLAN) injection 5 mg (5 mg Intramuscular Given 06/08/15  1744)  dexamethasone (DECADRON) injection 10 mg (10 mg Intramuscular Given 06/08/15 1745)    BP 121/85 mmHg  Pulse 105  Temp(Src) 98.3 F (36.8 C) (Oral)  Ht  (1.626 m)  Wt 217 lb 12 oz (98.771 kg)  BMI 37.36 kg/m2  SpO2 98% No data found.   Physical Exam  Constitutional: She is oriented to person, place, and time. She appears well-developed and well-nourished. No distress.  HENT:  Head: Normocephalic and atraumatic.  Right Ear: Tympanic membrane normal.  Left Ear: Tympanic membrane normal.  Nose: Mucosal edema present. Right sinus exhibits maxillary sinus tenderness and  frontal sinus tenderness. Left sinus exhibits maxillary sinus tenderness and frontal sinus tenderness.  Mouth/Throat: Uvula is midline, oropharynx is clear and moist and mucous membranes are normal.  Eyes: Conjunctivae and EOM are normal. Pupils are equal, round, and reactive to light. No scleral icterus.  Neck: Normal range of motion. Neck supple.  Cardiovascular: Normal rate, regular rhythm and normal heart sounds.   Mild tachycardia in triage, regular rate and rhythm on exam  Pulmonary/Chest: Effort normal and breath sounds normal. No respiratory distress. She has no wheezes. She has no rales. She exhibits no tenderness.  Abdominal: Soft. She exhibits no distension. There is no tenderness.  Musculoskeletal: Normal range of motion.  Neurological: She is alert and oriented to person, place, and time.  Speech is clear, alert to person, place, and time. Able to follow 2 step commands. Normal gait.   Skin: Skin is warm and dry. She is not diaphoretic.  Nursing note and vitals reviewed.   ED Course  Procedures (including critical care time)  Labs Review Labs Reviewed - No data to display  Imaging Review No results found.    MDM   1. Migraine without aura and without status migrainosus, not intractable   2. Acute rhinosinusitis    Pt with hx of migraines c/o persistent migraine due to stress and sinus congestion. No red flag symptoms.   Pt requested tx in UC  Tx: Toradol, Reglan, and Decadron Minimal relief of pain while in UC, but pt states she wants to go home to sleep it off. She has more reglan at home for nausea  Rx: Augmentin   Advised pt to use acetaminophen and ibuprofen as needed for fever and pain. Encouraged rest and fluids. F/u with PCP in 3-4 days if not improving, sooner if worsening. Pt verbalized understanding and agreement with tx plan.     Junius Finner, PA-C 06/09/15 [phone]

## 2015-06-08 NOTE — Discharge Instructions (Signed)
Migraine Headache A migraine headache is an intense, throbbing pain on one or both sides of your head. A migraine can last for 30 minutes to several hours. CAUSES  The exact cause of a migraine headache is not always known. However, a migraine may be caused when nerves in the brain become irritated and release chemicals that cause inflammation. This causes pain. Certain things may also trigger migraines, such as:  Alcohol.  Smoking.  Stress.  Menstruation.  Aged cheeses.  Foods or drinks that contain nitrates, glutamate, aspartame, or tyramine.  Lack of sleep.  Chocolate.  Caffeine.  Hunger.  Physical exertion.  Fatigue.  Medicines used to treat chest pain (nitroglycerine), birth control pills, estrogen, and some blood pressure medicines. SIGNS AND SYMPTOMS  Pain on one or both sides of your head.  Pulsating or throbbing pain.  Severe pain that prevents daily activities.  Pain that is aggravated by any physical activity.  Nausea, vomiting, or both.  Dizziness.  Pain with exposure to bright lights, loud noises, or activity.  General sensitivity to bright lights, loud noises, or smells. Before you get a migraine, you may get warning signs that a migraine is coming (aura). An aura may include:  Seeing flashing lights.  Seeing bright spots, halos, or zigzag lines.  Having tunnel vision or blurred vision.  Having feelings of numbness or tingling.  Having trouble talking.  Having muscle weakness. DIAGNOSIS  A migraine headache is often diagnosed based on:  Symptoms.  Physical exam.  A CT scan or MRI of your head. These imaging tests cannot diagnose migraines, but they can help rule out other causes of headaches. TREATMENT Medicines may be given for pain and nausea. Medicines can also be given to help prevent recurrent migraines.  HOME CARE INSTRUCTIONS  Only take over-the-counter or prescription medicines for pain or discomfort as directed by your  health care provider. The use of long-term narcotics is not recommended.  Lie down in a dark, quiet room when you have a migraine.  Keep a journal to find out what may trigger your migraine headaches. For example, write down:  What you eat and drink.  How much sleep you get.  Any change to your diet or medicines.  Limit alcohol consumption.  Quit smoking if you smoke.  Get 7-9 hours of sleep, or as recommended by your health care provider.  Limit stress.  Keep lights dim if bright lights bother you and make your migraines worse. SEEK IMMEDIATE MEDICAL CARE IF:   Your migraine becomes severe.  You have a fever.  You have a stiff neck.  You have vision loss.  You have muscular weakness or loss of muscle control.  You start losing your balance or have trouble walking.  You feel faint or pass out.  You have severe symptoms that are different from your first symptoms. MAKE SURE YOU:   Understand these instructions.  Will watch your condition.  Will get help right away if you are not doing well or get worse.   This information is not intended to replace advice given to you by your health care provider. Make sure you discuss any questions you have with your health care provider.   Document Released: 04/12/2005 Document Revised: 05/03/2014 Document Reviewed: 12/18/2012 Elsevier Interactive Patient Education 2016 [employer] Inc.  Sinus Rinse WHAT IS A SINUS RINSE? A sinus rinse is a simple home treatment that is used to rinse your sinuses with a sterile mixture of salt and water (saline solution). Sinuses are  air-filled spaces in your skull behind the bones of your face and forehead that open into your nasal cavity. You will use the following:  Saline solution.  Neti pot or spray bottle. This releases the saline solution into your nose and through your sinuses. Neti pots and spray bottles can be purchased at [employer], a health food store, or online. WHEN  WOULD I DO A SINUS RINSE? A sinus rinse can help to clear mucus, dirt, dust, or pollen from the nasal cavity. You may do a sinus rinse when you have a cold, a virus, nasal allergy symptoms, a sinus infection, or stuffiness in the nose or sinuses. If you are considering a sinus rinse:  Ask your child's health care provider before performing a sinus rinse on your child.  Do not do a sinus rinse if you have had ear or nasal surgery, ear infection, or blocked ears. HOW DO I DO A SINUS RINSE?  Wash your hands.  Disinfect your device according to the directions provided and then dry it.  Use the solution that comes with your device or one that is sold separately in stores. Follow the mixing directions on the package.  Fill your device with the amount of saline solution as directed by the device instructions.  Stand over a sink and tilt your head sideways over the sink.  Place the spout of the device in your upper nostril (the one closer to the ceiling).  Gently pour or squeeze the saline solution into the nasal cavity. The liquid should drain to the lower nostril if you are not overly congested.  Gently blow your nose. Blowing too hard may cause ear pain.  Repeat in the other nostril.  Clean and rinse your device with clean water and then air-dry it. ARE THERE RISKS OF A SINUS RINSE?  Sinus rinse is generally very safe and effective. However, there are a few risks, which include:   A burning sensation in the sinuses. This may happen if you do not make the saline solution as directed. Make sure to follow all directions when making the saline solution.  Infection from contaminated water. This is rare, but possible.  Nasal irritation.   This information is not intended to replace advice given to you by your health care provider. Make sure you discuss any questions you have with your health care provider.   Document Released: 11/07/2013 Document Reviewed: 11/07/2013 Elsevier Interactive  Patient Education [employer]

## 2015-06-18 ENCOUNTER — Other Ambulatory Visit: Payer: Self-pay | Admitting: Obstetrics and Gynecology

## 2015-09-29 ENCOUNTER — Encounter: Payer: Self-pay | Admitting: *Deleted

## 2015-09-29 ENCOUNTER — Emergency Department
Admission: EM | Admit: 2015-09-29 | Discharge: 2015-09-29 | Disposition: A | Discharge status: Home / Self Care | Payer: BLUE CROSS/BLUE SHIELD | Source: Home / Self Care | Attending: Family Medicine | Admitting: Family Medicine

## 2015-09-29 DIAGNOSIS — G43001 Migraine without aura, not intractable, with status migrainosus: Secondary | ICD-10-CM

## 2015-09-29 MED ORDER — DEXAMETHASONE SODIUM PHOSPHATE 10 MG/ML IJ SOLN
5.0000 mg | Freq: Once | INTRAMUSCULAR | Status: AC
  Administered 2015-09-29: 5 mg via INTRAMUSCULAR

## 2015-09-29 MED ORDER — METOCLOPRAMIDE HCL 5 MG/ML IJ SOLN
5.0000 mg | Freq: Once | INTRAMUSCULAR | Status: AC
  Administered 2015-09-29: 5 mg via INTRAMUSCULAR

## 2015-09-29 MED ORDER — KETOROLAC TROMETHAMINE 60 MG/2ML IM SOLN
60.0000 mg | Freq: Once | INTRAMUSCULAR | Status: AC
  Administered 2015-09-29: 60 mg via INTRAMUSCULAR

## 2015-09-29 NOTE — Discharge Instructions (Signed)
Continue present medications. °If symptoms become significantly worse during the night or over the weekend, proceed to the local emergency room.  °

## 2015-09-29 NOTE — ED Provider Notes (Signed)
CSN: [id number]     Arrival date & time 09/29/15  1218 History   First MD Initiated Contact with Patient 09/29/15 1237     Chief Complaint  Patient presents with  . Migraine      HPI Comments: Patient complains of 5 day history of migraine headache that has not responded to her usual treatment regimen.  Patient's last menstrual period was 09/26/2015.  She states her menstrual headaches are more difficult to control than her typical migraines, which are now manageable with her current medication.  She has had nausea without vomiting.  No fevers, chills, and sweats.  Patient is a 30 y.o. female presenting with migraines. The history is provided by the patient.  Migraine This is a recurrent problem. Episode onset: 5 days ago. The problem occurs constantly. The problem has not changed since onset.Exacerbated by: light and movement. Nothing relieves the symptoms. Treatments tried: Maxalt and Reglan. The treatment provided mild relief.    Past Medical History  Diagnosis Date  . Asthma   . Migraines   . GERD (gastroesophageal reflux disease)   . Obesity   . Shingles    Past Surgical History  Procedure Laterality Date  . Knee surgeries    . Cholecystectomy     Family History  Problem Relation Age of Onset  . Migraines Other   . Bipolar disorder Mother   . Diabetes Other   . Heart failure Other   . Heart attack Other   . Diabetes Other     type 1  . Heart disease Other   . Heart attack Other   . Heart disease Paternal Grandmother   . Heart disease Paternal Grandfather    Social History  Substance Use Topics  . Smoking status: Never Smoker   . Smokeless tobacco: None  . Alcohol Use: No   OB History    Gravida Para Term Preterm AB TAB SAB Ectopic Multiple Living   2 2 1 1  0 0 0 0 0 2     Review of Systems  All other systems reviewed and are negative.   Allergies  Codeine; Dilaudid; Morphine and related; Sudafed; and Tramadol  Home Medications   Prior to Admission  medications   Medication Sig Start Date End Date Taking? Authorizing Provider  metoCLOPramide (REGLAN) 10 MG tablet Take 10 mg by mouth 4 (four) times daily.   Yes Historical Provider, MD  naproxen sodium (ANAPROX) 220 MG tablet Take 220 mg by mouth 2 (two) times daily with a meal.   Yes Historical Provider, MD  rizatriptan (MAXALT) 10 MG tablet Take 10 mg by mouth as needed for migraine. May repeat in 2 hours if needed   Yes Historical Provider, MD  nortriptyline (PAMELOR) 25 MG capsule Take 50 mg by mouth. 01/22/15   Historical Provider, MD  pregabalin (LYRICA) 75 MG capsule Take 75 mg by mouth. 01/22/15   Historical Provider, MD   Meds Ordered and Administered this Visit   Medications  ketorolac (TORADOL) injection 60 mg (not administered)  dexamethasone (DECADRON) injection 5 mg (not administered)  metoCLOPramide (REGLAN) injection 5 mg (not administered)    BP 143/83 mmHg  Pulse 108  Temp(Src) 98.7 F (37.1 C) (Oral)  Resp 16  Ht 5\' 5"  (1.651 m)  Wt 221 lb (100.245 kg)  BMI 36.78 kg/m2  SpO2 97%  LMP 09/26/2015 No data found.   Physical Exam Nursing notes and Vital Signs reviewed. Appearance:  Patient appears stated age, and in no acute distress Eyes:  Pupils are equal, round, and reactive to light and accomodation.  Extraocular movement is intact.  Conjunctivae are not inflamed.  Fundi benign.  Mild photophobia present. Ears:  Canals normal.  Tympanic membranes normal.  Nose:  Normal turbinates.  No sinus tenderness.    Pharynx:  Normal Neck:  Supple.  Tender enlarged posterior/lateral nodes are palpated bilaterally  Lungs:  Clear to auscultation.  Breath sounds are equal.  Moving air well. Heart:  Regular rate and rhythm without murmurs, rubs, or gallops.  Abdomen:  Nontender without masses or hepatosplenomegaly.  Bowel sounds are present.  No CVA or flank tenderness.  Extremities:  No edema.  Skin:  No rash present.   Neurologic:  Cranial nerves 2 through 12 are  normal.  Patellar and elbow reflexes are normal.  Cerebellar function is intact (finger-to-nose and rapid alternating hand movement).  Gait and station are normal.     ED Course  Procedures none    MDM   1. Migraine without aura and with status migrainosus, not intractable    Administered Toradol  IM, Decadron  IM, and Reglan  IM  Continue present medications. If symptoms become significantly worse during the night or over the weekend, proceed to the local emergency room.    [name], MD 09/29/15 [phone]

## 2015-09-29 NOTE — ED Notes (Signed)
Pt c/o migraine x 09/24/15. She has taken Maxalt, Naproxen and reglan without relief.

## 2016-03-25 ENCOUNTER — Ambulatory Visit (INDEPENDENT_AMBULATORY_CARE_PROVIDER_SITE_OTHER): Payer: BLUE CROSS/BLUE SHIELD | Admitting: Cardiology

## 2016-03-25 ENCOUNTER — Encounter (INDEPENDENT_AMBULATORY_CARE_PROVIDER_SITE_OTHER): Payer: Self-pay

## 2016-03-25 ENCOUNTER — Encounter: Payer: Self-pay | Admitting: Cardiology

## 2016-03-25 VITALS — BP 128/72 | HR 99 | Ht 65.0 in | Wt 213.1 lb

## 2016-03-25 DIAGNOSIS — G43109 Migraine with aura, not intractable, without status migrainosus: Secondary | ICD-10-CM | POA: Diagnosis not present

## 2016-03-25 DIAGNOSIS — E6609 Other obesity due to excess calories: Secondary | ICD-10-CM

## 2016-03-25 DIAGNOSIS — R0789 Other chest pain: Secondary | ICD-10-CM

## 2016-03-25 NOTE — Progress Notes (Signed)
Cardiology Office Note    Date:  03/25/2016   ID:  [name], DOB 07/25/1985, MRN [id number]  PCP:  Leanor Rubenstein, MD  Cardiologist:   Donato Schultz, MD     History of Present Illness:  [name] is a 30 y.o. female here for the evaluation of chest pain at the request of [name]/Dr. Laurann Montana. Has risk factors of obesity, family history of MI. Non smoker. Non diabetic.   She was seen on 01/23/16 by Dr. Laurann Montana at [facility] with chest pressure starting in the evening hours that lasted approximate 5 hours till 2 AM and she eventually fell asleep. She felt shortness of breath. No nausea. She ate milk with cereal an hour before bed. Her heart felt as though it was beating prominently when happening. She's noted this symptom couple times over the past month. Every time she had the symptoms she was laying in bed. 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   Was on Augmentin at the time of her symptoms. Felt different than reflux. Felt sharp in the middle.   Her father had heart problems and hypertension starting in their 7s. Both grandfathers had myocardial infarctions. 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 In the past had taken Nexium. May have been more short of breath going up hills.  She was given some lorazepam to help with anxiety/stress. She was also encouraged to start Nexium.  Back in 2010 I saw her when she was pregnant and had palpitations. Her workup was negative.    Past Medical History:  Diagnosis Date  . Asthma   . GERD (gastroesophageal reflux disease)   . Migraines   . Obesity   . Shingles     Past Surgical History:  Procedure Laterality Date  . CHOLECYSTECTOMY    . knee surgeries      Current Medications: Outpatient Medications Prior to Visit  Medication Sig Dispense Refill  . metoCLOPramide (REGLAN) 10 MG tablet Take 10 mg by mouth 4 (four) times daily.    .  nortriptyline (PAMELOR) 25 MG capsule Take 50 mg by mouth.    . pregabalin (LYRICA) 75 MG capsule Take 75 mg by mouth.    . rizatriptan (MAXALT) 10 MG tablet Take 10 mg by mouth as needed for migraine. May repeat in 2 hours if needed    . naproxen sodium (ANAPROX) 220 MG tablet Take 220 mg by mouth 2 (two) times daily with a meal.     No facility-administered medications prior to visit.      Allergies:   Codeine; Dilaudid [hydromorphone hcl]; Morphine and related; Sudafed [pseudoephedrine hcl]; and Tramadol   Social History   Social History  . Marital status: Married    Spouse name: N/A  . Number of children: N/A  . Years of education: N/A   Social History Main Topics  . Smoking status: Never Smoker  . Smokeless tobacco: Never Used  . Alcohol use No  . Drug use: No  . Sexual activity: Yes    Partners: Male    Birth control/ protection: Coitus interruptus, Implant   Other Topics Concern  . None   Social History Narrative  . None     Family History:  The patient's family history includes Bipolar disorder in her mother; Diabetes in her other and other; Heart attack in her other and other; Heart disease in her other, paternal grandfather, and paternal grandmother;  Heart failure in her other; Migraines in her other.   ROS:   Please see the history of present illness.    ROS All other systems reviewed and are negative.   PHYSICAL EXAM:   VS:  BP 128/72   Pulse 99   Ht 5\' 5"  (1.651 m)   Wt 213 lb 1.9 oz (96.7 kg)   SpO2 97%   BMI 35.47 kg/m    GEN: Well nourished, well developed, in no acute distress  HEENT: normal  Neck: no JVD, carotid bruits, or masses Cardiac: RRR; no murmurs, rubs, or gallops,no edema  Respiratory:  clear to auscultation bilaterally, normal work of breathing GI: soft, nontender, nondistended, + BS MS: no deformity or atrophy  Skin: warm and dry, no rash Neuro:  Alert and Oriented x 3, Strength and sensation are intact Psych: euthymic mood, full  affect  Wt Readings from Last 3 Encounters:  03/25/16 213 lb 1.9 oz (96.7 kg)  09/29/15 221 lb (100.2 kg)  06/08/15 217 lb 12 oz (98.8 kg)      Studies/Labs Reviewed:   EKG: 01/23/16-sinus rhythm heart rate 76 bpm no other significant abnormalities. Personally viewed. Recent Labs: No results found for requested labs within last 8760 hours.   Hemoglobin 14.3 creatinine 0.82, TSH 0.59, LDL 105 HDL 44  Lipid Panel No results found for: CHOL, TRIG, HDL, CHOLHDL, VLDL, LDLCALC, LDLDIRECT  Additional studies/ records that were reviewed today include:  Office notes, lab work, EKG reviewed    ASSESSMENT:    1. Atypical chest pain   2. Class 1 obesity due to excess calories without serious comorbidity in adult, unspecified BMI   3. Migraine with aura and without status migrainosus, not intractable      PLAN:  In order of problems listed above:  Atypical chest pain  - Overall symptoms do seem to be consistent with GERD/esophagitis associated with Augmentin antibiotic, or perhaps doxycycline which she is taking as prescribed by her gynecologist. Make sure that she drinks a few full glass of water, meals with these medications and tries to avoid laying down immediately after taking.  - It would not be unreasonable to check an echocardiogram to ensure proper structure and function of her heart. We will order. Back in 2010 this was reassuring.  - EKG is reassuring.  Obesity  - Encourage weight loss. Good job of recent weight loss of approximately 8 pounds.  History of migraines/stress/anxiety  - Reviewed notes from Dr. Cliffton AstersWhite.  - Left-sided neck discomfort on initiation of migraines is likely part of her arm, muscular tightness. No evidence of vascular abnormalities on auscultation.  Family history of CAD  - Continue with exercise, diet, weight loss, primary prevention.   Medication Adjustments/Labs and Tests Ordered: Current medicines are reviewed at length with the patient  today.  Concerns regarding medicines are outlined above.  Medication changes, Labs and Tests ordered today are listed in the Patient Instructions below. Patient Instructions  Medication Instructions:  The current medical regimen is effective;  continue present plan and medications.  Testing/Procedures: Your physician has requested that you have an echocardiogram. Echocardiography is a painless test that uses sound waves to create images of your heart. It provides your doctor with information about the size and shape of your heart and how well your heart's chambers and valves are working. This procedure takes approximately one hour. There are no restrictions for this procedure.  Follow-Up: Further follow up will be based on the results of the above testing.  [facility] you for choosing [facility]!!        Signed, Donato SchultzMark Bryauna Byrum, MD  03/25/2016 9:14 AM    [facility] [address], [place], [place]  [address] Phone: [phone]; Fax: [phone]

## 2016-03-25 NOTE — Patient Instructions (Signed)
Medication Instructions:  The current medical regimen is effective;  continue present plan and medications.  Testing/Procedures: Your physician has requested that you have an echocardiogram. Echocardiography is a painless test that uses sound waves to create images of your heart. It provides your doctor with information about the size and shape of your heart and how well your heart's chambers and valves are working. This procedure takes approximately one hour. There are no restrictions for this procedure.  Follow-Up: Further follow up will be based on the results of the above testing.  Thank you for choosing [facility]!!

## 2016-04-21 ENCOUNTER — Other Ambulatory Visit (HOSPITAL_COMMUNITY): Payer: BLUE CROSS/BLUE SHIELD

## 2016-05-24 ENCOUNTER — Other Ambulatory Visit (HOSPITAL_COMMUNITY): Payer: BLUE CROSS/BLUE SHIELD

## 2017-01-20 ENCOUNTER — Other Ambulatory Visit: Payer: Self-pay | Admitting: Orthopedic Surgery

## 2017-01-20 DIAGNOSIS — M545 Low back pain: Secondary | ICD-10-CM

## 2017-02-03 ENCOUNTER — Other Ambulatory Visit: Payer: BLUE CROSS/BLUE SHIELD

## 2017-02-13 ENCOUNTER — Ambulatory Visit
Admission: RE | Admit: 2017-02-13 | Discharge: 2017-02-13 | Disposition: A | Discharge status: Home / Self Care | Payer: BLUE CROSS/BLUE SHIELD | Source: Ambulatory Visit | Attending: Orthopedic Surgery | Admitting: Orthopedic Surgery

## 2017-02-13 DIAGNOSIS — M545 Low back pain: Secondary | ICD-10-CM

## 2017-05-29 ENCOUNTER — Other Ambulatory Visit: Payer: Self-pay

## 2017-05-29 ENCOUNTER — Emergency Department
Admission: EM | Admit: 2017-05-29 | Discharge: 2017-05-29 | Disposition: A | Discharge status: Home / Self Care | Payer: BLUE CROSS/BLUE SHIELD | Source: Home / Self Care | Attending: Emergency Medicine | Admitting: Emergency Medicine

## 2017-05-29 ENCOUNTER — Encounter: Payer: Self-pay | Admitting: *Deleted

## 2017-05-29 DIAGNOSIS — J039 Acute tonsillitis, unspecified: Secondary | ICD-10-CM

## 2017-05-29 LAB — POCT RAPID STREP A (OFFICE): Rapid Strep A Screen: NEGATIVE

## 2017-05-29 MED ORDER — AMOXICILLIN 875 MG PO TABS: ORAL_TABLET | ORAL | 0 refills | Status: DC

## 2017-05-29 NOTE — ED Triage Notes (Signed)
Patient c/o sore throat x 6 days. Right ear pain x today. Afebrile. Patient reports she never gets a fever. Strep exposure at work.

## 2017-05-30 ENCOUNTER — Telehealth: Payer: Self-pay

## 2017-05-30 LAB — STREP A DNA PROBE: Group A Strep Probe: NOT DETECTED

## 2017-05-30 NOTE — Telephone Encounter (Signed)
Pt feeling much better.  Will follow up as needed. 

## 2017-05-30 NOTE — ED Provider Notes (Signed)
[facility]    CSN: [id number] Arrival date & time: 05/29/17  1212     History   Chief Complaint Chief Complaint  Patient presents with  . Sore Throat    HPI [name] is a 32 y.o. female.   HPI SORE THROAT Onset: 6-7 days ago.    Severity: moderate, progressively worsening Tried OTC meds without significant relief.  Symptoms:  + Low-grade Fever , and she states she never gets any kind of fever unless she has infection. + Swollen neck glands Positive for Recent Strep Exposure at work. She states in the past, rapid strep tests in negative even when strep culture ultimately proved +2 days later.     No Myalgias No Headache No Rash  No Discolored Nasal Mucus No Allergy symptoms No sinus pain/pressure No itchy/red eyes Has mild right ear pain, without drainage or bleeding. No left ear pain.  No Drooling No Trismus  No Nausea No Vomiting No Abdominal pain No Diarrhea No Reflux symptoms  No Cough No Breathing Difficulty No Shortness of Breath No pleuritic pain No Wheezing No Hemoptysis   Past Medical History:  Diagnosis Date  . Asthma   . GERD (gastroesophageal reflux disease)   . Migraines   . Obesity   . Shingles     Patient Active Problem List   Diagnosis Date Noted  . Obesity 06/25/2014  . Muscle spasm 06/05/2013  . Migraine 05/01/2013    Past Surgical History:  Procedure Laterality Date  . CHOLECYSTECTOMY    . knee surgeries      OB History    Gravida Para Term Preterm AB Living   2 2 1 1  0 2   SAB TAB Ectopic Multiple Live Births   0 0 0 0         Home Medications    Prior to Admission medications   Medication Sig Start Date End Date Taking? Authorizing Provider  carisoprodol (SOMA) 350 MG tablet TAKE 1 TABLET BY MOUTH UP TO 3 TIMES DAILY 03/08/16  Yes [provider]  Galcanezumab-gnlm (EMGALITY) 120 MG/ML SOAJ Inject into the skin every 30 (thirty) days.   Yes [provider]    imipramine (TOFRANIL-PM) 100 MG capsule Take 100 mg by mouth at bedtime.   Yes [provider]  metoCLOPramide (REGLAN) 10 MG tablet Take 10 mg by mouth 4 (four) times daily.   Yes [provider]  norethindrone (MICRONOR,CAMILA,ERRIN) 0.35 MG tablet Take 1 tablet by mouth daily.   Yes [provider]  rizatriptan (MAXALT) 10 MG tablet Take 10 mg by mouth as needed for migraine. May repeat in 2 hours if needed   Yes [provider]  amoxicillin (AMOXIL) 875 MG tablet Take 1 twice a day X 10 days. 05/29/17   Lajean ManesMassey, David, MD  nortriptyline (PAMELOR) 25 MG capsule Take 50 mg by mouth. 01/22/15   [provider]    Family History Family History  Problem Relation Age of Onset  . Bipolar disorder Mother   . Diabetes Other   . Heart failure Other   . Heart attack Other   . Diabetes Other        type 1  . Heart disease Other   . Heart attack Other   . Migraines Other   . Heart disease Paternal Grandmother   . Heart disease Paternal Grandfather     Social History Social History   Tobacco Use  . Smoking status: Never Smoker  . Smokeless tobacco:  Never Used  Substance Use Topics  . Alcohol use: No  . Drug use: No     Allergies   Codeine; Dilaudid [hydromorphone hcl]; Morphine and related; Sudafed [pseudoephedrine hcl]; and Tramadol   Review of Systems Review of Systems  All other systems reviewed and are negative.    Physical Exam Triage Vital Signs ED Triage Vitals  Enc Vitals Group     BP 05/29/17 1246 111/78     Pulse Rate 05/29/17 1246 75     Resp 05/29/17 1246 14     Temp 05/29/17 1246 98.5 F (36.9 C)     Temp Source 05/29/17 1246 Oral     SpO2 05/29/17 1246 100 %     Weight 05/29/17 1247 218 lb (98.9 kg)     Height --      Head Circumference --      Peak Flow --      Pain Score 05/29/17 1247 6     Pain Loc --      Pain Edu? --      Excl. in GC? --    No data found.  Updated Vital Signs BP 111/78 (BP  Location: Right Arm)   Pulse 75   Temp 98.5 F (36.9 C) (Oral)   Resp 14   Wt 218 lb (98.9 kg)   LMP 05/12/2017   SpO2 100%   BMI 36.28 kg/m    Physical Exam  Constitutional: She is oriented to person, place, and time. She appears well-developed and well-nourished.  Non-toxic appearance. She appears ill. No distress.  HENT:  Head: Normocephalic and atraumatic.  Right Ear: Tympanic membrane, external ear and ear canal normal.  Left Ear: Tympanic membrane, external ear and ear canal normal.  Nose: Nose normal. Right sinus exhibits no maxillary sinus tenderness and no frontal sinus tenderness. Left sinus exhibits no maxillary sinus tenderness and no frontal sinus tenderness.  Mouth/Throat: Uvula is midline and mucous membranes are normal. No oral lesions. Oropharyngeal exudate and posterior oropharyngeal erythema present. No posterior oropharyngeal edema or tonsillar abscesses.  + 3+ bilateral Tonsillar enlargement, mild whitish exudate. No fluctuance. Airway intact.  Eyes: Conjunctivae are normal. No scleral icterus.  Neck: Neck supple.  Cardiovascular: Normal rate, regular rhythm and normal heart sounds.  No murmur heard. Pulmonary/Chest: Effort normal and breath sounds normal. No stridor. No respiratory distress. She has no wheezes. She has no rhonchi. She has no rales.  Abdominal: Soft. She exhibits no mass. There is no hepatosplenomegaly. There is no tenderness.  Lymphadenopathy:    She has cervical adenopathy.       Right cervical: Superficial cervical adenopathy present. No deep cervical and no posterior cervical adenopathy present.      Left cervical: Superficial cervical adenopathy present. No deep cervical and no posterior cervical adenopathy present.  Neurological: She is alert and oriented to person, place, and time.  Skin: Skin is warm. No rash noted.  Psychiatric: She has a normal mood and affect.  Nursing note and vitals reviewed.    UC Treatments / Results   Labs (all labs ordered are listed, but only abnormal results are displayed) Labs Reviewed  STREP A DNA PROBE  POCT RAPID STREP A (OFFICE)    EKG  EKG Interpretation None       Radiology No results found.  Procedures Procedures (including critical care time)  Medications Ordered in UC Medications - No data to display   Initial Impression / Assessment and Plan / UC Course  I have reviewed  the triage vital signs and the nursing notes.  Pertinent labs & imaging results that were available during my care of the patient were reviewed by me and considered in my medical decision making (see chart for details).   Final Clinical Impressions(s) / UC Diagnoses   Final diagnoses:  Tonsillitis   Rapid strep test negative. Because of severe tonsillitis, discussed option of starting antibiotics now, even if rapid strep test is negative. Treatment options discussed, as well as risks, benefits, alternatives. Patient voiced understanding and agreement with the following plans:  ED Discharge Orders        Ordered    amoxicillin (AMOXIL) 875 MG tablet     05/29/17 1301    875 mg twice a day 10 days. Strep culture sent off. Follow-up with your primary care doctor in 5-7 days if not improving, or sooner if symptoms become worse. Precautions discussed. Red flags discussed. Questions invited and answered. Patient voiced understanding and agreement.    Lajean Manes, MD 05/30/17 [phone]

## 2018-01-04 ENCOUNTER — Emergency Department (INDEPENDENT_AMBULATORY_CARE_PROVIDER_SITE_OTHER)
Admission: EM | Admit: 2018-01-04 | Discharge: 2018-01-04 | Disposition: A | Discharge status: Home / Self Care | Payer: Managed Care, Other (non HMO) | Source: Home / Self Care | Attending: Family Medicine | Admitting: Family Medicine

## 2018-01-04 ENCOUNTER — Encounter: Payer: Self-pay | Admitting: Emergency Medicine

## 2018-01-04 DIAGNOSIS — R059 Cough, unspecified: Secondary | ICD-10-CM

## 2018-01-04 DIAGNOSIS — J029 Acute pharyngitis, unspecified: Secondary | ICD-10-CM

## 2018-01-04 DIAGNOSIS — R0982 Postnasal drip: Secondary | ICD-10-CM

## 2018-01-04 DIAGNOSIS — R05 Cough: Secondary | ICD-10-CM

## 2018-01-04 LAB — POCT RAPID STREP A (OFFICE): Rapid Strep A Screen: NEGATIVE

## 2018-01-04 MED ORDER — IPRATROPIUM BROMIDE 0.06 % NA SOLN: 2.0000 | Freq: Four times a day (QID) | NASAL | 1 refills | Status: AC

## 2018-01-04 MED ORDER — PREDNISONE 20 MG PO TABS: ORAL_TABLET | ORAL | 0 refills | Status: DC

## 2018-01-04 NOTE — ED Provider Notes (Signed)
[name]    CSN: [id number] Arrival date & time: 01/04/18  1407     History   Chief Complaint Chief Complaint  Patient presents with  . Sore Throat    HPI [name] is a 32 y.o. female.   HPI  [name] is a 32 y.o. female presenting to UC with c/o sore throat and congestion for 3 days. Pain worse with swallowing.  She took 4 Aleve earlier w/o relief. A coworker also has strep throat.  She also has a dry cough, frontal headache and post-nasal drip.  She use to use Nasonex nasal spray but has not used "in a while"  Denies fever, chills, n/v/d.     Past Medical History:  Diagnosis Date  . Asthma   . GERD (gastroesophageal reflux disease)   . Migraines   . Obesity   . Shingles     Patient Active Problem List   Diagnosis Date Noted  . Obesity 06/25/2014  . Muscle spasm 06/05/2013  . Migraine 05/01/2013    Past Surgical History:  Procedure Laterality Date  . CHOLECYSTECTOMY    . knee surgeries      OB History    Gravida  2   Para  2   Term  1   Preterm  1   AB  0   Living  2     SAB  0   TAB  0   Ectopic  0   Multiple  0   Live Births               Home Medications    Prior to Admission medications   Medication Sig Start Date End Date Taking? Authorizing Provider  amoxicillin (AMOXIL) 875 MG tablet Take 1 twice a day X 10 days. 05/29/17   Lajean Manes, MD  carisoprodol (SOMA) 350 MG tablet TAKE 1 TABLET BY MOUTH UP TO 3 TIMES DAILY 03/08/16   [provider]  Galcanezumab-gnlm (EMGALITY) 120 MG/ML SOAJ Inject into the skin every 30 (thirty) days.    [provider]  imipramine (TOFRANIL-PM) 100 MG capsule Take 100 mg by mouth at bedtime.    [provider]  ipratropium (ATROVENT) 0.06 % nasal spray Place 2 sprays into both nostrils 4 (four) times daily. 01/04/18   Lurene Shadow, PA-C  metoCLOPramide (REGLAN) 10 MG tablet Take 10 mg by mouth 4 (four) times daily.    [provider]  norethindrone (MICRONOR,CAMILA,ERRIN) 0.35 MG tablet Take 1 tablet by mouth daily.    [provider]  nortriptyline (PAMELOR) 25 MG capsule Take 50 mg by mouth. 01/22/15   [provider]  predniSONE (DELTASONE) 20 MG tablet Take 2 tabs at breakfast for 2 days, take 1 tab at breakfast for 3 days 01/04/18   Lurene Shadow, PA-C  rizatriptan (MAXALT) 10 MG tablet Take 10 mg by mouth as needed for migraine. May repeat in 2 hours if needed    [provider]    Family History Family History  Problem Relation Age of Onset  . Bipolar disorder Mother   . Diabetes Other   . Heart failure Other   . Heart attack Other   . Diabetes Other        type 1  . Heart disease Other   . Heart attack Other   . Migraines Other   . Heart disease Paternal Grandmother   . Heart disease Paternal Grandfather     Social History Social  History   Tobacco Use  . Smoking status: Never Smoker  . Smokeless tobacco: Never Used  Substance Use Topics  . Alcohol use: No  . Drug use: No     Allergies   Codeine; Dilaudid [hydromorphone hcl]; Morphine and related; Sudafed [pseudoephedrine hcl]; and Tramadol   Review of Systems Review of Systems  Constitutional: Negative for chills and fever.  HENT: Positive for congestion, postnasal drip and sore throat. Negative for ear pain, trouble swallowing and voice change.   Respiratory: Positive for cough. Negative for shortness of breath.   Cardiovascular: Negative for chest pain and palpitations.  Gastrointestinal: Negative for abdominal pain, diarrhea, nausea and vomiting.  Musculoskeletal: Negative for arthralgias, back pain and myalgias.  Skin: Negative for rash.  Neurological: Positive for headaches. Negative for dizziness and light-headedness.     Physical Exam Triage Vital Signs ED Triage Vitals  Enc Vitals Group     BP      Pulse      Resp      Temp      Temp src      SpO2      Weight      Height       Head Circumference      Peak Flow      Pain Score      Pain Loc      Pain Edu?      Excl. in GC?    No data found.  Updated Vital Signs BP 123/89 (BP Location: Right Arm)   Pulse 82   Temp 98.2 F (36.8 C) (Oral)   Wt 213 lb (96.6 kg)   SpO2 99%   BMI 35.45 kg/m   Visual Acuity Right Eye Distance:   Left Eye Distance:   Bilateral Distance:    Right Eye Near:   Left Eye Near:    Bilateral Near:     Physical Exam  Constitutional: She is oriented to person, place, and time. She appears well-developed and well-nourished.  Non-toxic appearance. She does not appear ill. No distress.  HENT:  Head: Normocephalic and atraumatic.  Right Ear: Tympanic membrane normal.  Left Ear: Tympanic membrane normal.  Nose: Nose normal. Right sinus exhibits no maxillary sinus tenderness and no frontal sinus tenderness. Left sinus exhibits no maxillary sinus tenderness and no frontal sinus tenderness.  Mouth/Throat: Uvula is midline and mucous membranes are normal. Posterior oropharyngeal erythema (slight, with clear-yellow post-nasal drip) present.  Eyes: EOM are normal.  Neck: Normal range of motion. Neck supple. No thyromegaly present.  Cardiovascular: Normal rate and regular rhythm.  Pulmonary/Chest: Effort normal and breath sounds normal. No respiratory distress. She has no wheezes. She has no rhonchi.  Musculoskeletal: Normal range of motion.  Lymphadenopathy:    She has cervical adenopathy.  Neurological: She is alert and oriented to person, place, and time.  Skin: Skin is warm and dry.  Psychiatric: She has a normal mood and affect. Her behavior is normal.  Nursing note and vitals reviewed.    UC Treatments / Results  Labs (all labs ordered are listed, but only abnormal results are displayed) Labs Reviewed  STREP A DNA PROBE  POCT RAPID STREP A (OFFICE)  POCT RAPID STREP A (OFFICE)    EKG None  Radiology No results found.  Procedures Procedures (including critical  care time)  Medications Ordered in UC Medications - No data to display  Initial Impression / Assessment and Plan / UC Course  I have reviewed the triage vital signs  and the nursing notes.  Pertinent labs & imaging results that were available during my care of the patient were reviewed by me and considered in my medical decision making (see chart for details).     Rapid strep: NEGATIVE Culture sent Will tx as viral illness at this time.   Final Clinical Impressions(s) / UC Diagnoses   Final diagnoses:  Pharyngitis, unspecified etiology  Post-nasal drip  Cough     Discharge Instructions      You may take 500mg  acetaminophen every 4-6 hours or in combination with ibuprofen 400-600mg  every 6-8 hours as needed for pain, inflammation, and fever.  Be sure to drink at least eight 8oz glasses of water to stay well hydrated and get at least 8 hours of sleep at night, preferably more while sick.   Your rapid strep test was Negative today, however, a more detailed test known as a culture has been sent to the lab for more testing. This takes about 24-48 hours.  If the culture comes back Positive, we will let you know and call in an antibiotic right away.  If it is negative, your symptoms are likely from a virus and should resolve within 1 week.  If you develop trouble breathing or swallowing liquids, please be re-evaluated by a medical provider.   Please follow up with family medicine in 1 week if not improving.     ED Prescriptions    Medication Sig Dispense Auth. Provider   ipratropium (ATROVENT) 0.06 % nasal spray Place 2 sprays into both nostrils 4 (four) times daily. 15 mL Catherina Pates O, PA-C   predniSONE (DELTASONE) 20 MG tablet Take 2 tabs at breakfast for 2 days, take 1 tab at breakfast for 3 days 7 tablet Lurene Shadow, PA-C     Controlled Substance Prescriptions [place] Controlled Substance Registry consulted? Not Applicable   Rolla Plate 01/04/18 1610

## 2018-01-04 NOTE — ED Triage Notes (Signed)
Pt c/o sore throat and congestion x3 days. Hx of frequent strep andstates coworker with strep.

## 2018-01-04 NOTE — Discharge Instructions (Signed)
°  You may take 500mg  acetaminophen every 4-6 hours or in combination with ibuprofen 400-600mg  every 6-8 hours as needed for pain, inflammation, and fever.  Be sure to drink at least eight 8oz glasses of water to stay well hydrated and get at least 8 hours of sleep at night, preferably more while sick.   Your rapid strep test was Negative today, however, a more detailed test known as a culture has been sent to the lab for more testing. This takes about 24-48 hours.  If the culture comes back Positive, we will let you know and call in an antibiotic right away.  If it is negative, your symptoms are likely from a virus and should resolve within 1 week.  If you develop trouble breathing or swallowing liquids, please be re-evaluated by a medical provider.   Please follow up with family medicine in 1 week if not improving.

## 2018-01-05 ENCOUNTER — Telehealth: Payer: Self-pay | Admitting: *Deleted

## 2018-01-05 LAB — STREP A DNA PROBE: Group A Strep Probe: NOT DETECTED

## 2018-01-05 NOTE — Telephone Encounter (Signed)
Attempted to call patient. No answer, no voicemail set up

## 2018-01-06 NOTE — Telephone Encounter (Signed)
Message left on voice mail inquiring about patient's status and encouraging patient to call with questions/concerns.  

## 2018-03-06 ENCOUNTER — Other Ambulatory Visit: Payer: Self-pay

## 2018-03-06 ENCOUNTER — Encounter: Payer: Self-pay | Admitting: Emergency Medicine

## 2018-03-06 ENCOUNTER — Emergency Department (INDEPENDENT_AMBULATORY_CARE_PROVIDER_SITE_OTHER)
Admission: EM | Admit: 2018-03-06 | Discharge: 2018-03-06 | Disposition: A | Discharge status: Home / Self Care | Payer: Managed Care, Other (non HMO) | Source: Home / Self Care | Attending: Family Medicine | Admitting: Family Medicine

## 2018-03-06 DIAGNOSIS — J0100 Acute maxillary sinusitis, unspecified: Secondary | ICD-10-CM

## 2018-03-06 DIAGNOSIS — J069 Acute upper respiratory infection, unspecified: Secondary | ICD-10-CM

## 2018-03-06 MED ORDER — AMOXICILLIN-POT CLAVULANATE 875-125 MG PO TABS: 1.0000 | ORAL_TABLET | Freq: Two times a day (BID) | ORAL | 0 refills | Status: DC

## 2018-03-06 MED ORDER — BENZONATATE 100 MG PO CAPS: 100.0000 mg | ORAL_CAPSULE | Freq: Three times a day (TID) | ORAL | 0 refills | Status: DC

## 2018-03-06 NOTE — ED Triage Notes (Signed)
Cough 2.5 weeks, sinus pain, pressure x 4 days, congestion thick yellow to green mucus. She has been using Nasonex, netti pot and humidifier with Vicks, not getting better.

## 2018-03-06 NOTE — ED Provider Notes (Signed)
[name]    CSN: [id number] Arrival date & time: 03/06/18  4098     History   Chief Complaint Chief Complaint  Patient presents with  . Sinus Problem    HPI [name] is a 32 y.o. female.   HPI  [name] is a 32 y.o. female presenting to UC with c/o 2.5 weeks of mildly productive cough with 4 days of worsening sinus pain and pressure, and nasal congestion with thick yellow/green mucous. She has tried Nasonex, netti pot and a humidifier with Vicks w/o relief. Denies fever, chills, n/v/d.    Past Medical History:  Diagnosis Date  . Asthma   . GERD (gastroesophageal reflux disease)   . Migraines   . Obesity   . Shingles     Patient Active Problem List   Diagnosis Date Noted  . Obesity 06/25/2014  . Muscle spasm 06/05/2013  . Migraine 05/01/2013    Past Surgical History:  Procedure Laterality Date  . CHOLECYSTECTOMY    . knee surgeries      OB History    Gravida  2   Para  2   Term  1   Preterm  1   AB  0   Living  2     SAB  0   TAB  0   Ectopic  0   Multiple  0   Live Births               Home Medications    Prior to Admission medications   Medication Sig Start Date End Date Taking? Authorizing Provider  amoxicillin-clavulanate (AUGMENTIN) 875-125 MG tablet Take 1 tablet by mouth 2 (two) times daily. One po bid x 7 days 03/06/18   Lurene Shadow, PA-C  benzonatate (TESSALON) 100 MG capsule Take 1-2 capsules (100-200 mg total) by mouth every 8 (eight) hours. 03/06/18   Lurene Shadow, PA-C  carisoprodol (SOMA) 350 MG tablet TAKE 1 TABLET BY MOUTH UP TO 3 TIMES DAILY 03/08/16   [provider]  Galcanezumab-gnlm (EMGALITY) 120 MG/ML SOAJ Inject into the skin every 30 (thirty) days.    [provider]  imipramine (TOFRANIL-PM) 100 MG capsule Take 100 mg by mouth at bedtime.    [provider]  ipratropium (ATROVENT) 0.06 % nasal spray Place 2 sprays into both nostrils 4 (four)  times daily. 01/04/18   Lurene Shadow, PA-C  metoCLOPramide (REGLAN) 10 MG tablet Take 10 mg by mouth 4 (four) times daily.    [provider]  norethindrone (MICRONOR,CAMILA,ERRIN) 0.35 MG tablet Take 1 tablet by mouth daily.    [provider]  nortriptyline (PAMELOR) 25 MG capsule Take 50 mg by mouth. 01/22/15   [provider]  rizatriptan (MAXALT) 10 MG tablet Take 10 mg by mouth as needed for migraine. May repeat in 2 hours if needed    [provider]    Family History Family History  Problem Relation Age of Onset  . Bipolar disorder Mother   . Diabetes Other   . Heart failure Other   . Heart attack Other   . Diabetes Other        type 1  . Heart disease Other   . Heart attack Other   . Migraines Other   . Heart disease Paternal Grandmother   . Heart disease Paternal Grandfather     Social History Social History   Tobacco Use  . Smoking status: Never Smoker  . Smokeless tobacco:  Never Used  Substance Use Topics  . Alcohol use: No  . Drug use: No     Allergies   Codeine; Dilaudid [hydromorphone hcl]; Morphine and related; Sudafed [pseudoephedrine hcl]; and Tramadol   Review of Systems Review of Systems  Constitutional: Negative for chills and fever.  HENT: Positive for congestion, ear pain (Right), sinus pressure and sinus pain. Negative for ear discharge and sore throat.   Respiratory: Positive for cough. Negative for chest tightness, shortness of breath and wheezing.   Neurological: Positive for headaches. Negative for dizziness and light-headedness.     Physical Exam Triage Vital Signs ED Triage Vitals  Enc Vitals Group     BP 03/06/18 0846 118/84     Pulse Rate 03/06/18 0846 87     Resp --      Temp 03/06/18 0846 98.4 F (36.9 C)     Temp Source 03/06/18 0846 Oral     SpO2 03/06/18 0846 98 %     Weight 03/06/18 0847 215 lb (97.5 kg)     Height 03/06/18 0847 5\' 4"  (1.626 m)     Head Circumference --      Peak  Flow --      Pain Score 03/06/18 0846 6     Pain Loc --      Pain Edu? --      Excl. in GC? --    No data found.  Updated Vital Signs BP 118/84 (BP Location: Right Arm)   Pulse 87   Temp 98.4 F (36.9 C) (Oral)   Ht 5\' 4"  (1.626 m)   Wt 215 lb (97.5 kg)   SpO2 98%   BMI 36.90 kg/m   Visual Acuity Right Eye Distance:   Left Eye Distance:   Bilateral Distance:    Right Eye Near:   Left Eye Near:    Bilateral Near:     Physical Exam  Constitutional: She is oriented to person, place, and time. She appears well-developed and well-nourished.  HENT:  Head: Normocephalic and atraumatic.  Right Ear: Tympanic membrane normal.  Left Ear: Tympanic membrane normal.  Nose: Right sinus exhibits maxillary sinus tenderness. Right sinus exhibits no frontal sinus tenderness. Left sinus exhibits maxillary sinus tenderness. Left sinus exhibits no frontal sinus tenderness.  Mouth/Throat: Uvula is midline, oropharynx is clear and moist and mucous membranes are normal.  Eyes: EOM are normal.  Neck: Normal range of motion. Neck supple.  Cardiovascular: Normal rate and regular rhythm.  Pulmonary/Chest: Effort normal and breath sounds normal. No stridor. No respiratory distress. She has no wheezes. She has no rales.  Musculoskeletal: Normal range of motion.  Neurological: She is alert and oriented to person, place, and time.  Skin: Skin is warm and dry.  Psychiatric: She has a normal mood and affect. Her behavior is normal.  Nursing note and vitals reviewed.    UC Treatments / Results  Labs (all labs ordered are listed, but only abnormal results are displayed) Labs Reviewed - No data to display  EKG None  Radiology No results found.  Procedures Procedures (including critical care time)  Medications Ordered in UC Medications - No data to display  Initial Impression / Assessment and Plan / UC Course  I have reviewed the triage vital signs and the nursing notes.  Pertinent labs  & imaging results that were available during my care of the patient were reviewed by me and considered in my medical decision making (see chart for details).     Hx and  exam c/w maxillary sinusitis secondary to URI  Will tx with Augmentin and tessalon May continue sinus rinses F/u with PCP in 1 week if needed.  Final Clinical Impressions(s) / UC Diagnoses   Final diagnoses:  Acute non-recurrent maxillary sinusitis  Acute upper respiratory infection   Discharge Instructions   None    ED Prescriptions    Medication Sig Dispense Auth. Provider   amoxicillin-clavulanate (AUGMENTIN) 875-125 MG tablet Take 1 tablet by mouth 2 (two) times daily. One po bid x 7 days 14 tablet Louan Base O, PA-C   benzonatate (TESSALON) 100 MG capsule Take 1-2 capsules (100-200 mg total) by mouth every 8 (eight) hours. 21 capsule Lurene Shadow, PA-C     Controlled Substance Prescriptions [place] Controlled Substance Registry consulted? Not Applicable   Lurene Shadow, PA-C 03/06/18 1019

## 2018-05-02 ENCOUNTER — Emergency Department (INDEPENDENT_AMBULATORY_CARE_PROVIDER_SITE_OTHER)
Admission: EM | Admit: 2018-05-02 | Discharge: 2018-05-02 | Disposition: A | Discharge status: Home / Self Care | Payer: Managed Care, Other (non HMO) | Source: Home / Self Care | Attending: Family Medicine | Admitting: Family Medicine

## 2018-05-02 ENCOUNTER — Encounter: Payer: Self-pay | Admitting: *Deleted

## 2018-05-02 DIAGNOSIS — J069 Acute upper respiratory infection, unspecified: Secondary | ICD-10-CM | POA: Diagnosis not present

## 2018-05-02 DIAGNOSIS — B9789 Other viral agents as the cause of diseases classified elsewhere: Secondary | ICD-10-CM

## 2018-05-02 DIAGNOSIS — J4521 Mild intermittent asthma with (acute) exacerbation: Secondary | ICD-10-CM

## 2018-05-02 LAB — POCT RAPID STREP A (OFFICE): Rapid Strep A Screen: NEGATIVE

## 2018-05-02 MED ORDER — PREDNISONE 20 MG PO TABS: ORAL_TABLET | ORAL | 0 refills | Status: DC

## 2018-05-02 MED ORDER — BENZONATATE 200 MG PO CAPS: ORAL_CAPSULE | ORAL | 0 refills | Status: DC

## 2018-05-02 MED ORDER — AZITHROMYCIN 250 MG PO TABS: ORAL_TABLET | ORAL | 0 refills | Status: DC

## 2018-05-02 MED ORDER — ALBUTEROL SULFATE HFA 108 (90 BASE) MCG/ACT IN AERS: 2.0000 | INHALATION_SPRAY | RESPIRATORY_TRACT | 0 refills | Status: AC | PRN

## 2018-05-02 NOTE — Discharge Instructions (Addendum)
Take plain guaifenesin (1200mg  extended release tabs such as Mucinex) twice daily, with plenty of water, for cough and congestion. Get adequate rest.   May use Afrin nasal spray (or generic oxymetazoline) each morning for about 5 days and then discontinue.  Also recommend using saline nasal spray several times daily and saline nasal irrigation (AYR is a common brand).  Use Flonase nasal spray each morning after using Afrin nasal spray and saline nasal irrigation. Try warm salt water gargles for sore throat.  Stop all antihistamines for now, and other non-prescription cough/cold preparations. May take Delsym Cough Suppressant with Tessalon at bedtime for nighttime cough.  Begin Azithromycin if not improving about one week or if persistent fever develops

## 2018-05-02 NOTE — ED Provider Notes (Signed)
[name]    CSN: [id number] Arrival date & time: 05/02/18  0818     History   Chief Complaint Chief Complaint  Patient presents with  . Cough  . Shortness of Breath    HPI [name] is a 33 y.o. female.   One week ago patient developed typical cold-like symptoms developing over several days, including initial severe sore throat followed by sinus congestion, fatigue, and cough.  She had been exposed to strep pharyngitis so began taking left over Keflex at the onset of her sore throat.  Her sore throat improved but cough gradually worsened to the extent that she has developed wheezing. She denies pleuritic pain but has had wheezing at night.  She has a history of asthma, generally quiescent when she is well.  She has been using her daughter's albuterol MDI with improvement.  The history is provided by the patient.    Past Medical History:  Diagnosis Date  . Asthma   . GERD (gastroesophageal reflux disease)   . Migraines   . Obesity   . Shingles     Patient Active Problem List   Diagnosis Date Noted  . Obesity 06/25/2014  . Muscle spasm 06/05/2013  . Migraine 05/01/2013    Past Surgical History:  Procedure Laterality Date  . CHOLECYSTECTOMY    . knee surgeries      OB History    Gravida  2   Para  2   Term  1   Preterm  1   AB  0   Living  2     SAB  0   TAB  0   Ectopic  0   Multiple  0   Live Births               Home Medications    Prior to Admission medications   Medication Sig Start Date End Date Taking? Authorizing Provider  carisoprodol (SOMA) 350 MG tablet TAKE 1 TABLET BY MOUTH UP TO 3 TIMES DAILY 03/08/16  Yes [provider]  Galcanezumab-gnlm (EMGALITY) 120 MG/ML SOAJ Inject into the skin every 30 (thirty) days.   Yes [provider]  imipramine (TOFRANIL-PM) 100 MG capsule Take 100 mg by mouth at bedtime.   Yes [provider]  norethindrone (MICRONOR,CAMILA,ERRIN) 0.35 MG  tablet Take 1 tablet by mouth daily.   Yes [provider]  albuterol (PROVENTIL HFA;VENTOLIN HFA) 108 (90 Base) MCG/ACT inhaler Inhale 2 puffs into the lungs every 4 (four) hours as needed for wheezing or shortness of breath. 05/02/18   [name], MD  azithromycin (ZITHROMAX Z-PAK) 250 MG tablet Take 2 tabs today; then begin one tab once daily for 4 more days. (Rx void after 05/10/18) 05/02/18   [name], MD  benzonatate (TESSALON) 200 MG capsule Take one cap by mouth at bedtime as needed for cough.  May repeat in 4 to 6 hours 05/02/18   [name], MD  ipratropium (ATROVENT) 0.06 % nasal spray Place 2 sprays into both nostrils 4 (four) times daily. 01/04/18   Lurene Shadow, PA-C  metoCLOPramide (REGLAN) 10 MG tablet Take 10 mg by mouth 4 (four) times daily.    [provider]  nortriptyline (PAMELOR) 25 MG capsule Take 50 mg by mouth. 01/22/15   [provider]  predniSONE (DELTASONE) 20 MG tablet Take one tab by mouth twice daily for 5 days, then one daily for 3 days. Take with food. 05/02/18   Kamala Kolton,  Tera MaterStephen A, MD  rizatriptan (MAXALT) 10 MG tablet Take 10 mg by mouth as needed for migraine. May repeat in 2 hours if needed    [provider]    Family History Family History  Problem Relation Age of Onset  . Bipolar disorder Mother   . Diabetes Other   . Heart failure Other   . Heart attack Other   . Diabetes Other        type 1  . Heart disease Other   . Heart attack Other   . Healthy Father   . Migraines Other   . Heart disease Paternal Grandmother   . Heart disease Paternal Grandfather     Social History Social History   Tobacco Use  . Smoking status: Never Smoker  . Smokeless tobacco: Never Used  Substance Use Topics  . Alcohol use: No  . Drug use: No     Allergies   Codeine; Dilaudid [hydromorphone hcl]; Morphine and related; Sudafed [pseudoephedrine hcl]; and Tramadol   Review of Systems Review of Systems + sore  throat + cough No pleuritic pain, but feels tight in anterior chest + wheezing + nasal congestion + post-nasal drainage No sinus pain/pressure No itchy/red eyes ? earache No hemoptysis + SOB No fever/chills No nausea No vomiting No abdominal pain No diarrhea No urinary symptoms No skin rash + fatigue No myalgias No headache Used OTC meds without relief   Physical Exam Triage Vital Signs ED Triage Vitals [05/02/18 0840]  Enc Vitals Group     BP 130/87     Pulse Rate 100     Resp 16     Temp 98.1 F (36.7 C)     Temp Source Oral     SpO2 98 %     Weight 215 lb (97.5 kg)     Height 5\' 4"  (1.626 m)     Head Circumference      Peak Flow      Pain Score 0     Pain Loc      Pain Edu?      Excl. in GC?    No data found.  Updated Vital Signs BP 130/87 (BP Location: Right Arm)   Pulse 100   Temp 98.1 F (36.7 C) (Oral)   Resp 16   Ht 5\' 4"  (1.626 m)   Wt 97.5 kg   SpO2 98%   BMI 36.90 kg/m   Visual Acuity Right Eye Distance:   Left Eye Distance:   Bilateral Distance:    Right Eye Near:   Left Eye Near:    Bilateral Near:     Physical Exam Nursing notes and Vital Signs reviewed. Appearance:  Patient appears stated age, and in no acute distress Eyes:  Pupils are equal, round, and reactive to light and accomodation.  Extraocular movement is intact.  Conjunctivae are not inflamed  Ears:  Canals normal.  Tympanic membranes normal.  Nose:  Mildly congested turbinates.  No sinus tenderness. Pharynx:  Normal Neck:  Supple.  Enlarged nontender posterior/lateral nodes are palpated bilaterally.  Tonsillar nodes are tender but not enlarged. Lungs:  Clear to auscultation.  Breath sounds are equal.  Moving air well. Chest has no tenderness to palpation. Heart:  Regular rate and rhythm without murmurs, rubs, or gallops.  Abdomen:  Nontender without masses or hepatosplenomegaly.  Bowel sounds are present.  No CVA or flank tenderness.  Extremities:  No edema.  Skin:   No rash present.    UC Treatments /  Results  Labs (all labs ordered are listed, but only abnormal results are displayed) Labs Reviewed  POCT RAPID STREP A (OFFICE) negative    EKG None  Radiology No results found.  Procedures Procedures (including critical care time)  Medications Ordered in UC Medications - No data to display  Initial Impression / Assessment and Plan / UC Course  I have reviewed the triage vital signs and the nursing notes.  Pertinent labs & imaging results that were available during my care of the patient were reviewed by me and considered in my medical decision making (see chart for details).    There is no evidence of bacterial infection today.   Begin prednisone burst/taper.  Resume albuterol inhaler. Prescription written for Benzonatate [facility]) to take at bedtime for night-time cough.  Followup with Family Doctor if not improved in about 10 days.   Final Clinical Impressions(s) / UC Diagnoses   Final diagnoses:  Viral URI with cough  Mild intermittent asthma with exacerbation     Discharge Instructions     Take plain guaifenesin (1200mg  extended release tabs such as Mucinex) twice daily, with plenty of water, for cough and congestion. Get adequate rest.   May use Afrin nasal spray (or generic oxymetazoline) each morning for about 5 days and then discontinue.  Also recommend using saline nasal spray several times daily and saline nasal irrigation (AYR is a common brand).  Use Flonase nasal spray each morning after using Afrin nasal spray and saline nasal irrigation. Try warm salt water gargles for sore throat.  Stop all antihistamines for now, and other non-prescription cough/cold preparations. May take Delsym Cough Suppressant with Tessalon at bedtime for nighttime cough.  Begin Azithromycin if not improving about one week or if persistent fever develops  (Given a prescription to hold, with an expiration date)      ED Prescriptions     Medication Sig Dispense Auth. Provider   predniSONE (DELTASONE) 20 MG tablet Take one tab by mouth twice daily for 5 days, then one daily for 3 days. Take with food. 13 tablet [name], MD   benzonatate (TESSALON) 200 MG capsule Take one cap by mouth at bedtime as needed for cough.  May repeat in 4 to 6 hours 15 capsule [name], MD   azithromycin (ZITHROMAX Z-PAK) 250 MG tablet Take 2 tabs today; then begin one tab once daily for 4 more days. (Rx void after 05/10/18) 6 tablet [name], MD   albuterol (PROVENTIL HFA;VENTOLIN HFA) 108 (90 Base) MCG/ACT inhaler Inhale 2 puffs into the lungs every 4 (four) hours as needed for wheezing or shortness of breath. 1 Inhaler [name], MD         [name], MD 05/02/18 [phone]

## 2018-05-02 NOTE — ED Triage Notes (Signed)
Productive cough-green, and dyspnea x1 week. Worse today. OTC meds not working.

## 2018-05-03 ENCOUNTER — Telehealth: Payer: Self-pay

## 2018-05-03 LAB — STREP A DNA PROBE: Group A Strep Probe: NOT DETECTED

## 2018-05-03 NOTE — Telephone Encounter (Signed)
Pt notified of TCX results.

## 2018-05-12 ENCOUNTER — Telehealth: Payer: Self-pay

## 2018-05-12 MED ORDER — AZITHROMYCIN 250 MG PO TABS: ORAL_TABLET | ORAL | 0 refills | Status: DC

## 2018-05-12 NOTE — Telephone Encounter (Signed)
Pt called and said cold sx have progressed into sinus, but script expired.  Spoke with Dr Cleta Alberts, and oked to resend script.  Sent to [facility].

## 2018-11-20 ENCOUNTER — Encounter: Payer: Self-pay | Admitting: Emergency Medicine

## 2018-11-20 ENCOUNTER — Emergency Department (INDEPENDENT_AMBULATORY_CARE_PROVIDER_SITE_OTHER)
Admission: EM | Admit: 2018-11-20 | Discharge: 2018-11-20 | Disposition: A | Discharge status: Home / Self Care | Payer: Managed Care, Other (non HMO) | Source: Home / Self Care | Attending: Family Medicine | Admitting: Family Medicine

## 2018-11-20 ENCOUNTER — Other Ambulatory Visit: Payer: Self-pay

## 2018-11-20 DIAGNOSIS — J029 Acute pharyngitis, unspecified: Secondary | ICD-10-CM | POA: Diagnosis not present

## 2018-11-20 DIAGNOSIS — M26622 Arthralgia of left temporomandibular joint: Secondary | ICD-10-CM | POA: Diagnosis not present

## 2018-11-20 LAB — POCT RAPID STREP A (OFFICE): Rapid Strep A Screen: NEGATIVE

## 2018-11-20 MED ORDER — PREDNISONE 20 MG PO TABS: ORAL_TABLET | ORAL | 0 refills | Status: AC

## 2018-11-20 NOTE — ED Provider Notes (Signed)
[facility]    CSN: [id number] Arrival date & time: 11/20/18  1743     History   Chief Complaint Chief Complaint  Patient presents with  . Otalgia    HPI [name] is a 33 y.o. female.   Patient complains of left earache for one week, with mild soreness in her left throat.  She feels well otherwise.  The history is provided by the patient.    Past Medical History:  Diagnosis Date  . Asthma   . GERD (gastroesophageal reflux disease)   . Migraines   . Obesity   . Shingles     Patient Active Problem List   Diagnosis Date Noted  . Obesity 06/25/2014  . Muscle spasm 06/05/2013  . Migraine 05/01/2013    Past Surgical History:  Procedure Laterality Date  . CHOLECYSTECTOMY    . knee surgeries      OB History    Gravida  2   Para  2   Term  1   Preterm  1   AB  0   Living  2     SAB  0   TAB  0   Ectopic  0   Multiple  0   Live Births               Home Medications    Prior to Admission medications   Medication Sig Start Date End Date Taking? Authorizing Provider  albuterol (PROVENTIL HFA;VENTOLIN HFA) 108 (90 Base) MCG/ACT inhaler Inhale 2 puffs into the lungs every 4 (four) hours as needed for wheezing or shortness of breath. 05/02/18   Kandra Nicolas, MD  carisoprodol (SOMA) 350 MG tablet TAKE 1 TABLET BY MOUTH UP TO 3 TIMES DAILY 03/08/16   [provider]  Galcanezumab-gnlm (EMGALITY) 120 MG/ML SOAJ Inject into the skin every 30 (thirty) days.    [provider]  imipramine (TOFRANIL-PM) 100 MG capsule Take 100 mg by mouth at bedtime.    [provider]  ipratropium (ATROVENT) 0.06 % nasal spray Place 2 sprays into both nostrils 4 (four) times daily. 01/04/18   Noe Gens, PA-C  metoCLOPramide (REGLAN) 10 MG tablet Take 10 mg by mouth 4 (four) times daily.    [provider]  norethindrone (MICRONOR,CAMILA,ERRIN) 0.35 MG tablet Take 1 tablet by mouth daily.    [provider]  predniSONE (DELTASONE) 20 MG tablet Take one tab by mouth twice daily for 4 days, then one daily. Take with food. 11/20/18   Kandra Nicolas, MD  rizatriptan (MAXALT) 10 MG tablet Take 10 mg by mouth as needed for migraine. May repeat in 2 hours if needed    [provider]    Family History Family History  Problem Relation Age of Onset  . Bipolar disorder Mother   . Diabetes Other   . Heart failure Other   . Heart attack Other   . Diabetes Other        type 1  . Heart disease Other   . Heart attack Other   . Healthy Father   . Migraines Other   . Heart disease Paternal Grandmother   . Heart disease Paternal Grandfather     Social History Social History   Tobacco Use  . Smoking status: Never Smoker  . Smokeless tobacco: Never Used  Substance Use Topics  . Alcohol use: No  . Drug use: No     Allergies   Codeine, Dilaudid [hydromorphone hcl], Morphine  and related, Sudafed [pseudoephedrine hcl], and Tramadol   Review of Systems Review of Systems + left sore throat No cough No pleuritic pain No wheezing No nasal congestion No post-nasal drainage No sinus pain/pressure No itchy/red eyes + left earache No hemoptysis No SOB No fever/chills No nausea No vomiting No abdominal pain No diarrhea No urinary symptoms No skin rash No fatigue No myalgias No headache Used OTC meds without relief   Physical Exam Triage Vital Signs ED Triage Vitals  Enc Vitals Group     BP 11/20/18 1801 121/85     Pulse Rate 11/20/18 1801 89     Resp --      Temp 11/20/18 1801 98.7 F (37.1 C)     Temp Source 11/20/18 1801 Oral     SpO2 11/20/18 1801 98 %     Weight 11/20/18 1802 215 lb (97.5 kg)     Height 11/20/18 1802 5\' 5"  (1.651 m)     Head Circumference --      Peak Flow --      Pain Score 11/20/18 1802 4     Pain Loc --      Pain Edu? --      Excl. in GC? --    No data found.  Updated Vital Signs BP 121/85 (BP Location: Right Arm)    Pulse 89   Temp 98.7 F (37.1 C) (Oral)   Ht 5\' 5"  (1.651 m)   Wt 97.5 kg   SpO2 98%   BMI 35.78 kg/m   Visual Acuity Right Eye Distance:   Left Eye Distance:   Bilateral Distance:    Right Eye Near:   Left Eye Near:    Bilateral Near:     Physical Exam Nursing notes and Vital Signs reviewed. Appearance:  Patient appears stated age, and in no acute distress Eyes:  Pupils are equal, round, and reactive to light and accomodation.  Extraocular movement is intact.  Conjunctivae are not inflamed  Ears:  Canals partly occluded with cerumen.  Tympanic membranes not fully visualized.  There is distinct tenderness over the left temporomandibular joint.  Palpation there recreates her pain.   Nose:  Mildly congested turbinates.  No sinus tenderness.  Pharynx:  Normal Neck:  Supple. Mildly enlarged tonsillar node on left. Lungs:  Clear to auscultation.  Breath sounds are equal.  Moving air well. Heart:  Regular rate and rhythm without murmurs, rubs, or gallops.  Abdomen:  Nontender without masses or hepatosplenomegaly.  Bowel sounds are present.  No CVA or flank tenderness.  Extremities:  No edema.  Skin:  No rash present.    UC Treatments / Results  Labs (all labs ordered are listed, but only abnormal results are displayed) Labs Reviewed  STREP A DNA PROBE  POCT RAPID STREP A (OFFICE) negative  Tympanometry:  Right ear tympanogram normal; Left ear tympanogram normal  EKG   Radiology No results found.  Procedures Procedures (including critical care time)  Medications Ordered in UC Medications - No data to display  Initial Impression / Assessment and Plan / UC Course  I have reviewed the triage vital signs and the nursing notes.  Pertinent labs & imaging results that were available during my care of the patient were reviewed by me and considered in my medical decision making (see chart for details).    Throat culture pending. Suspect viral syndrome.  Begin prednisone  burst/taper. Followup with ENT if not improved about 2 weeks.   Final Clinical Impressions(s) / UC  Diagnoses   Final diagnoses:  Acute pharyngitis, unspecified etiology  Arthralgia of left temporomandibular joint   Discharge Instructions   None    ED Prescriptions    Medication Sig Dispense Auth. Provider   predniSONE (DELTASONE) 20 MG tablet Take one tab by mouth twice daily for 4 days, then one daily. Take with food. 12 tablet Lattie HawBeese, Stephen A, MD        Lattie HawBeese, Stephen A, MD 11/23/18 [phone]

## 2018-11-20 NOTE — ED Triage Notes (Signed)
LT earache x 1 week

## 2018-11-22 ENCOUNTER — Telehealth: Payer: Self-pay | Admitting: *Deleted

## 2018-11-22 LAB — STREP A DNA PROBE: Group A Strep Probe: NOT DETECTED

## 2018-11-22 NOTE — Telephone Encounter (Signed)
LM with Tcx results.

## 2019-02-22 IMAGING — MR MR LUMBAR SPINE W/O CM
4 of 5 series · 26 of 48 positions shown · non-contrast
Comparison: MRI lumbar spine 02/11/2007.

CLINICAL DATA: Low back pain. BILATERAL leg pain. Symptoms began 4
years ago.

EXAM:
MRI LUMBAR SPINE WITHOUT CONTRAST
TECHNIQUE: Multiplanar, multisequence MR imaging of the lumbar spine was
performed. No intravenous contrast was administered.

[Series 4: T2 post-contrast · sagittal · 4.0mm · 0.55mm/px · 5 of 13 slices shown]
[im 1/13]
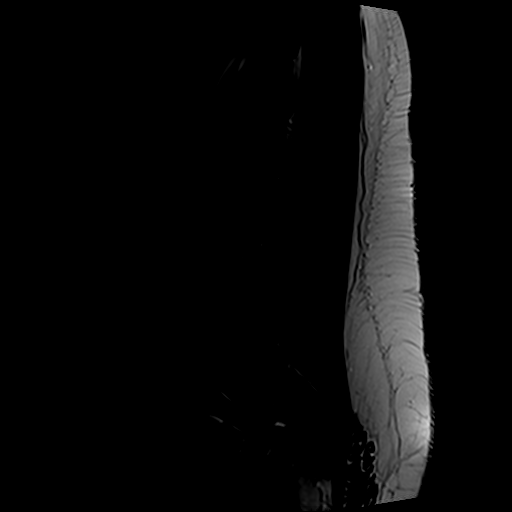
[im 4/13]
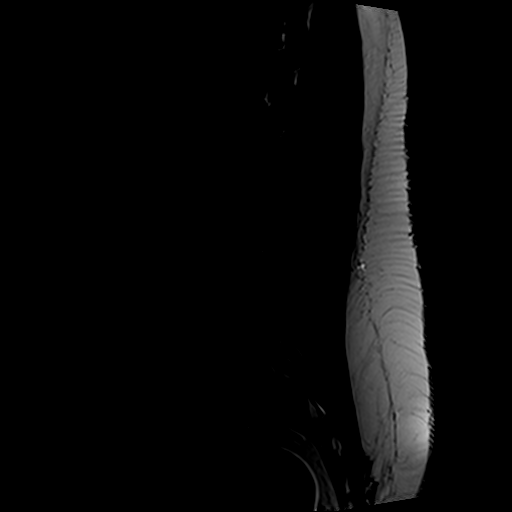
[im 7/13]
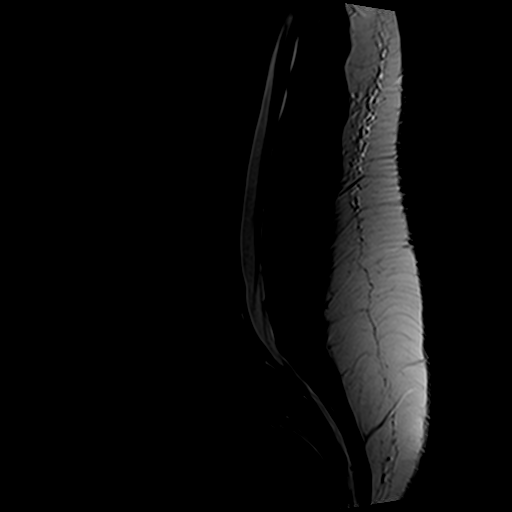
[im 10/13]
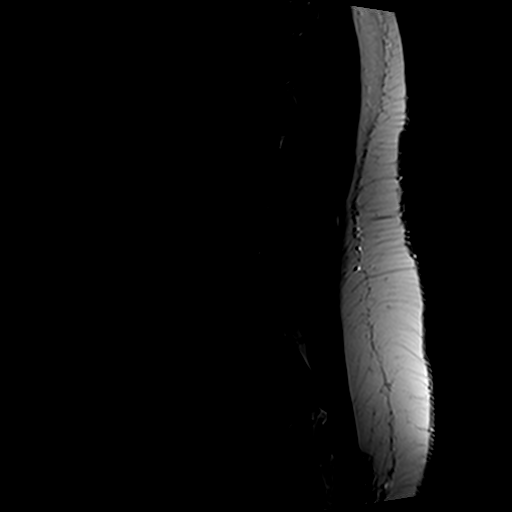
[im 13/13]
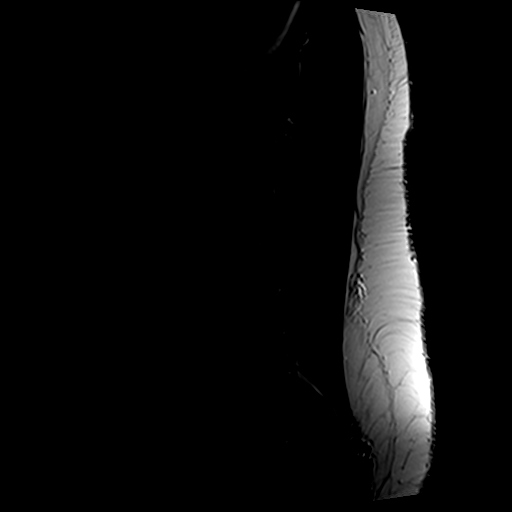

[Series 5: T1 · sagittal · 4.0mm · 0.55mm/px · 5 of 13 slices shown (1 of 2)]
[im 1/13]
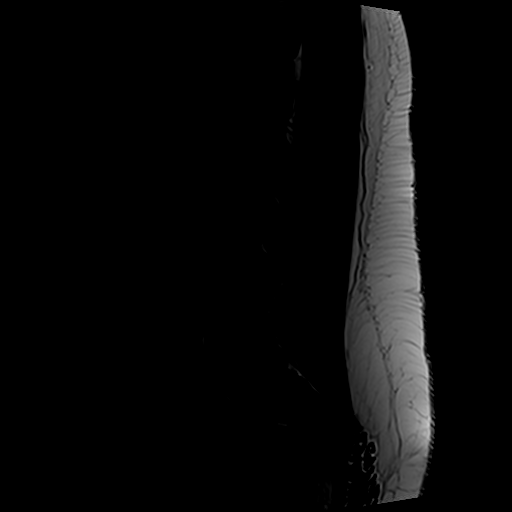
[im 4/13]
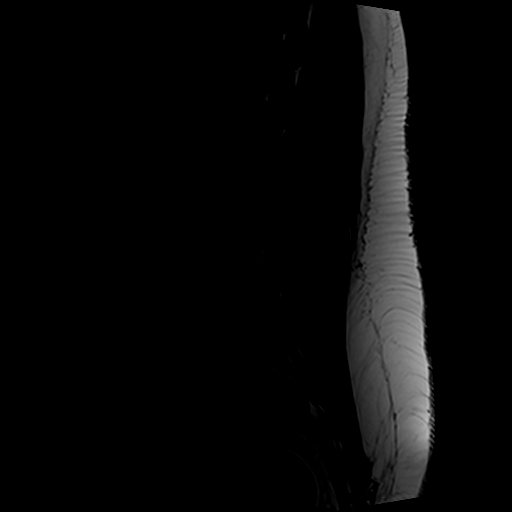
[im 7/13]
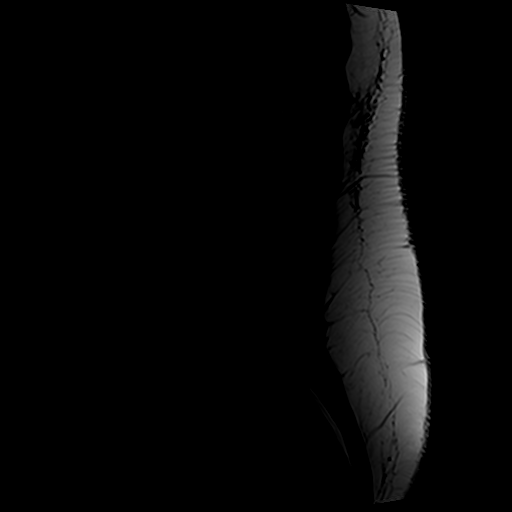
[im 10/13]
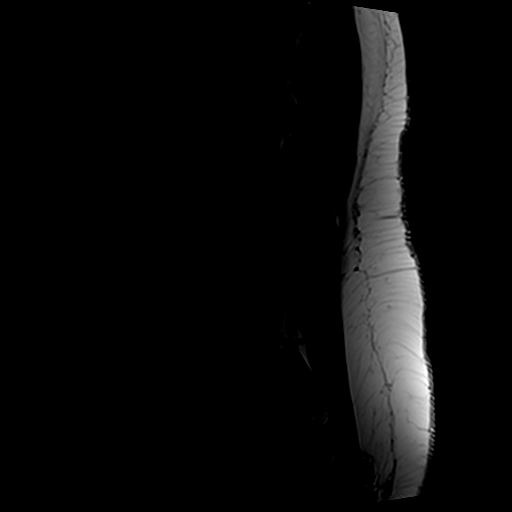
[im 13/13]
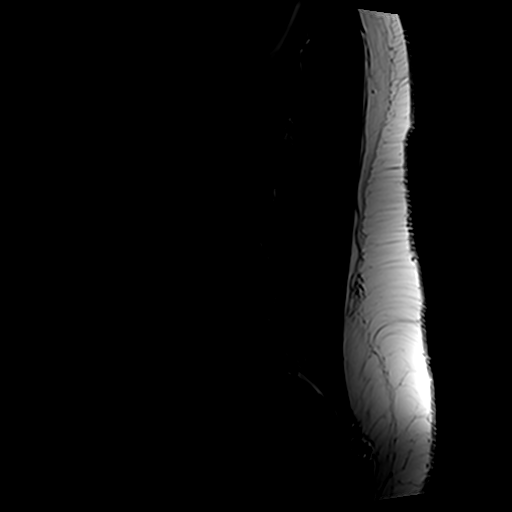

[Series 6: T2 · axial · 4.0mm · 0.70mm/px · z∈[-173,+44]mm · 10 of 41 slices shown]
[im 3/41]
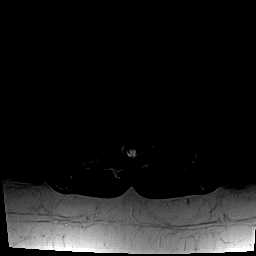
[im 6/41]
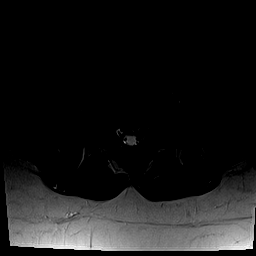
[im 9/41]
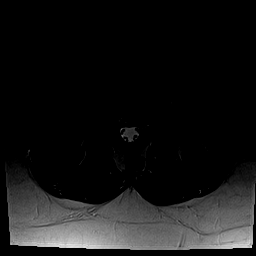
[im 14/41]
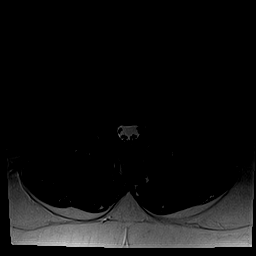
[im 19/41]
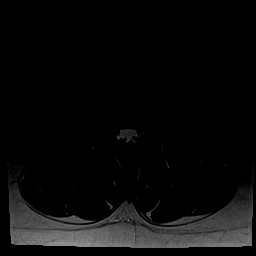
[im 22/41]
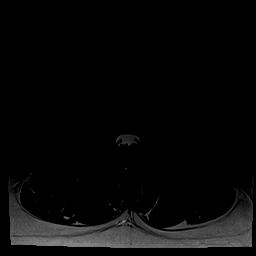
[im 25/41]
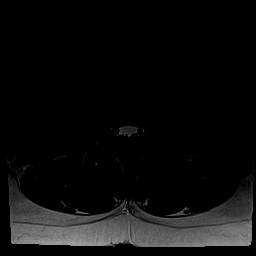
[im 30/41]
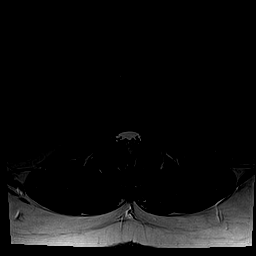
[im 35/41]
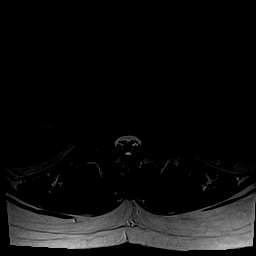
[im 41/41]
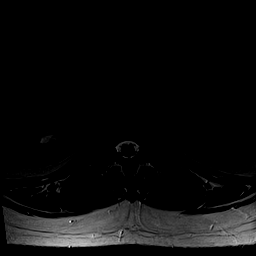

[Series 7: T1 · axial · 4.0mm · 0.35mm/px · z∈[-173,+13]mm · 6 of 41 slices shown (2 of 2)]
[im 3/41]
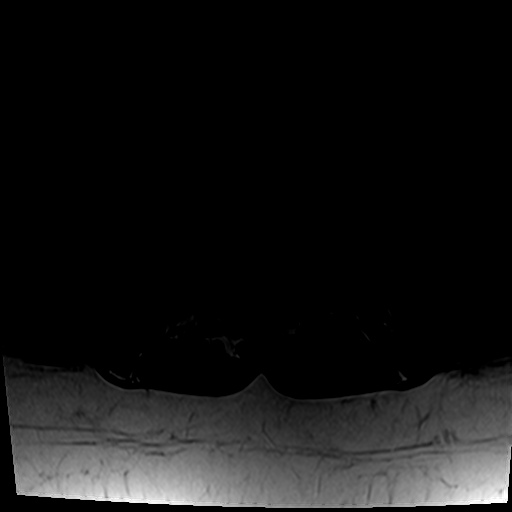
[im 6/41]
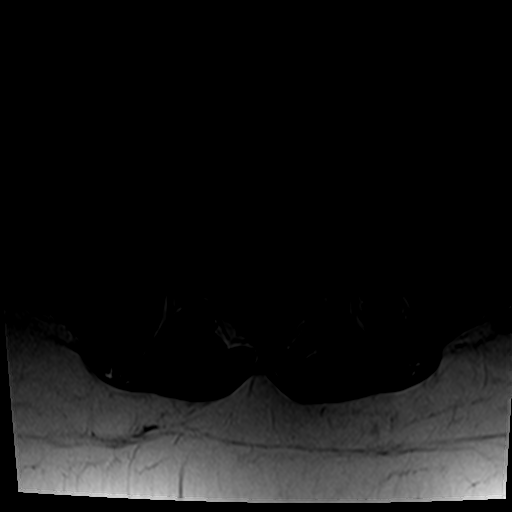
[im 9/41]
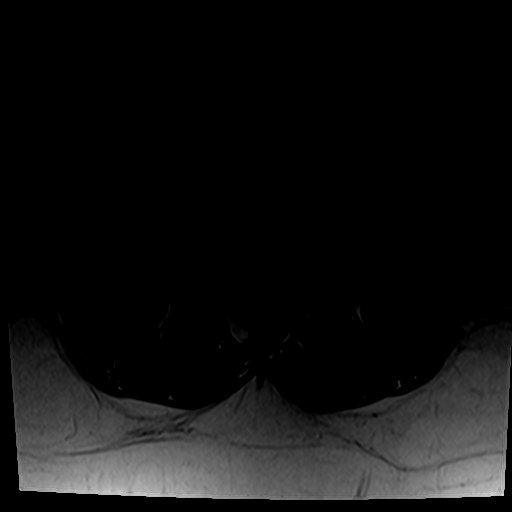
[im 14/41]
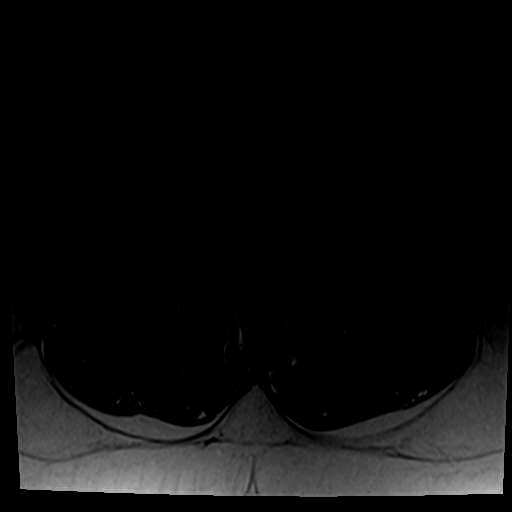
[im 22/41]
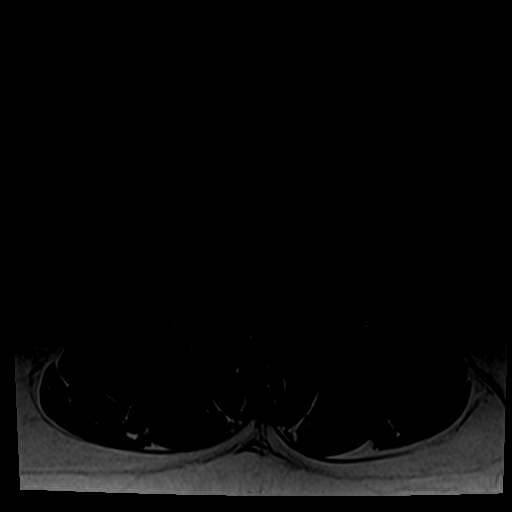
[im 35/41]
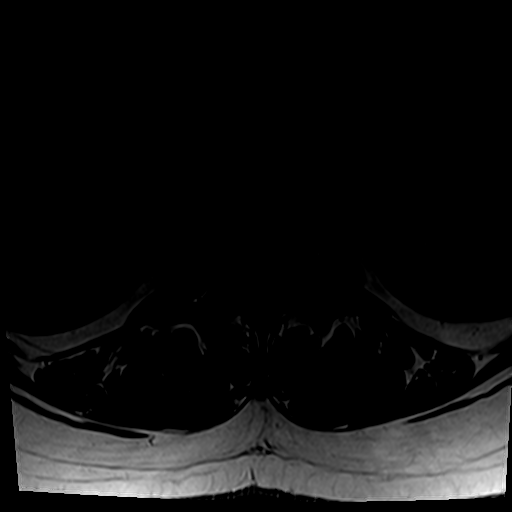

[26 of 48 positions shown; findings below may reference images not displayed]

FINDINGS: Segmentation:  Standard.

Alignment:  Physiologic.

Vertebrae:  No fracture, evidence of discitis, or bone lesion.

Conus medullaris: Extends to the L1 level and appears normal.

Paraspinal and other soft tissues: Unremarkable. Retroverted uterus.

Disc levels:

L1-L2:  Normal.

L2-L3:  Normal.

L3-L4:  Normal.

L4-L5: Disc desiccation. Central and rightward protrusion with
annular tear. Mild facet arthropathy. Borderline stenosis. Mild
subarticular zone narrowing could affect either L5 nerve root.

L5-S1:  Normal.

Visualized sacroiliac joints demonstrate no inflammatory change.

Compared with 8221, the findings at L4-5 are new.
IMPRESSION: Shallow central and rightward protrusion at L4-5. Borderline
stenosis with mild subarticular zone narrowing which could affect
either L5 nerve root.

Otherwise negative exam.

## 2020-01-14 ENCOUNTER — Emergency Department (INDEPENDENT_AMBULATORY_CARE_PROVIDER_SITE_OTHER)
Admission: EM | Admit: 2020-01-14 | Discharge: 2020-01-14 | Disposition: A | Discharge status: Home / Self Care | Payer: Managed Care, Other (non HMO) | Source: Home / Self Care

## 2020-01-14 ENCOUNTER — Other Ambulatory Visit: Payer: Self-pay

## 2020-01-14 DIAGNOSIS — J0100 Acute maxillary sinusitis, unspecified: Secondary | ICD-10-CM | POA: Diagnosis not present

## 2020-01-14 MED ORDER — KETOROLAC TROMETHAMINE 60 MG/2ML IM SOLN
30.0000 mg | Freq: Once | INTRAMUSCULAR | Status: AC
  Administered 2020-01-14: 30 mg via INTRAMUSCULAR

## 2020-01-14 MED ORDER — AMOXICILLIN-POT CLAVULANATE 875-125 MG PO TABS: 1.0000 | ORAL_TABLET | Freq: Two times a day (BID) | ORAL | 0 refills | Status: AC

## 2020-01-14 MED ORDER — AMOXICILLIN-POT CLAVULANATE 875-125 MG PO TABS: 1.0000 | ORAL_TABLET | Freq: Two times a day (BID) | ORAL | 0 refills | Status: DC

## 2020-01-14 NOTE — Discharge Instructions (Signed)

## 2020-01-14 NOTE — ED Triage Notes (Addendum)
Pt presents to Urgent Care with c/o headache x approx 3 weeks. Partially subsides w/ ibuprofen. States she believes she has a sinus infection d/t "always getting one this time of year." C/o maxillary pressure and occasional drainage in throat. Pt has hx of migraines and takes medications for this--no relief of HA w/ Maxalt. Pt w/ no known COVID exposure. Pt has been fully vaccinated against COVID.

## 2020-01-14 NOTE — ED Provider Notes (Signed)
[name]    CSN: [id number] Arrival date & time: 01/14/20  1425      History   Chief Complaint Chief Complaint  Patient presents with  . Headache    HPI ELINA STRENG is a 34 y.o. female.   HPI AMILLIA BIFFLE is a 34 y.o. female presenting to UC with c/o intermittent headache for about 3 weeks, subsides with ibuprofen but now is more persistent in Right side, feels similar to prior sinus infections she gets around this time of year.  Mild sinus congestion and post-nasal drainage.  Denies fever, chills, n/vd/. No sick contacts. She is fully vaccinated for COVID.   Past Medical History:  Diagnosis Date  . Asthma   . GERD (gastroesophageal reflux disease)   . Migraines   . Obesity   . Shingles     Patient Active Problem List   Diagnosis Date Noted  . Obesity 06/25/2014  . Muscle spasm 06/05/2013  . Migraine 05/01/2013    Past Surgical History:  Procedure Laterality Date  . CHOLECYSTECTOMY    . knee surgeries      OB History    Gravida  2   Para  2   Term  1   Preterm  1   AB  0   Living  2     SAB  0   TAB  0   Ectopic  0   Multiple  0   Live Births               Home Medications    Prior to Admission medications   Medication Sig Start Date End Date Taking? Authorizing Provider  Galcanezumab-gnlm [facility]) 120 MG/ML SOAJ Inject into the skin every 30 (thirty) days.   Yes [provider]  ipratropium (ATROVENT) 0.06 % nasal spray Place 2 sprays into both nostrils 4 (four) times daily. 01/04/18  Yes Madesyn Ast O, PA-C  norethindrone (MICRONOR,CAMILA,ERRIN) 0.35 MG tablet Take 1 tablet by mouth daily.   Yes [provider]  Rimegepant Sulfate (NURTEC PO) Take by mouth.   Yes [provider]  rizatriptan (MAXALT) 10 MG tablet Take 10 mg by mouth as needed for migraine. May repeat in 2 hours if needed   Yes [provider]  albuterol (PROVENTIL HFA;VENTOLIN HFA) 108 (90 Base) MCG/ACT  inhaler Inhale 2 puffs into the lungs every 4 (four) hours as needed for wheezing or shortness of breath. 05/02/18   [name], MD  amoxicillin-clavulanate (AUGMENTIN) 875-125 MG tablet Take 1 tablet by mouth 2 (two) times daily. One po bid x 7 days 01/14/20   Lurene Shadow, PA-C  carisoprodol (SOMA) 350 MG tablet TAKE 1 TABLET BY MOUTH UP TO 3 TIMES DAILY 03/08/16   [provider]  imipramine (TOFRANIL-PM) 100 MG capsule Take 100 mg by mouth at bedtime.    [provider]  metoCLOPramide (REGLAN) 10 MG tablet Take 10 mg by mouth 4 (four) times daily.    [provider]  predniSONE (DELTASONE) 20 MG tablet Take one tab by mouth twice daily for 4 days, then one daily. Take with food. 11/20/18   [name], MD    Family History Family History  Problem Relation Age of Onset  . Bipolar disorder Mother   . Diabetes Other   . Heart failure Other   . Heart attack Other   . Diabetes Other        type 1  . Heart disease Other   .  Heart attack Other   . Healthy Father   . Migraines Other   . Heart disease Paternal Grandmother   . Heart disease Paternal Grandfather     Social History Social History   Tobacco Use  . Smoking status: Never Smoker  . Smokeless tobacco: Never Used  Vaping Use  . Vaping Use: Never used  Substance Use Topics  . Alcohol use: No  . Drug use: No     Allergies   Codeine, Dilaudid [hydromorphone hcl], Morphine and related, Sudafed [pseudoephedrine hcl], and Tramadol   Review of Systems Review of Systems  Constitutional: Negative for chills and fever.  HENT: Positive for congestion, ear pain, postnasal drip, rhinorrhea, sinus pressure, sinus pain and sore throat. Negative for trouble swallowing and voice change.   Respiratory: Negative for cough and shortness of breath.   Cardiovascular: Negative for chest pain and palpitations.  Gastrointestinal: Negative for abdominal pain, diarrhea, nausea and vomiting.    Musculoskeletal: Negative for arthralgias, back pain and myalgias.  Skin: Negative for rash.  Neurological: Positive for headaches. Negative for dizziness, syncope, weakness and light-headedness.  All other systems reviewed and are negative.    Physical Exam Triage Vital Signs ED Triage Vitals  Enc Vitals Group     BP 01/14/20 1458 (!) 135/97     Pulse Rate 01/14/20 1458 89     Resp --      Temp 01/14/20 1458 98.5 F (36.9 C)     Temp Source 01/14/20 1458 Oral     SpO2 01/14/20 1458 99 %     Weight --      Height --      Head Circumference --      Peak Flow --      Pain Score 01/14/20 1450 6     Pain Loc --      Pain Edu? --      Excl. in GC? --    No data found.  Updated Vital Signs BP 129/87 (BP Location: Right Arm)   Pulse 89   Temp 98.5 F (36.9 C) (Oral)   LMP 01/04/2020   SpO2 99%   Visual Acuity Right Eye Distance:   Left Eye Distance:   Bilateral Distance:    Right Eye Near:   Left Eye Near:    Bilateral Near:     Physical Exam Vitals and nursing note reviewed.  Constitutional:      General: She is not in acute distress.    Appearance: She is well-developed. She is not ill-appearing, toxic-appearing or diaphoretic.  HENT:     Head: Normocephalic and atraumatic.     Right Ear: Tympanic membrane and ear canal normal.     Left Ear: Tympanic membrane and ear canal normal.     Nose:     Right Sinus: Maxillary sinus tenderness and frontal sinus tenderness present.     Left Sinus: No maxillary sinus tenderness or frontal sinus tenderness.     Mouth/Throat:     Lips: Pink.     Mouth: Mucous membranes are moist.     Pharynx: Oropharynx is clear. Uvula midline.  Cardiovascular:     Rate and Rhythm: Normal rate and regular rhythm.  Pulmonary:     Effort: Pulmonary effort is normal.     Breath sounds: Normal breath sounds. No stridor. No wheezing, rhonchi or rales.  Musculoskeletal:        General: No tenderness. Normal range of motion.     Cervical  back: Normal range of  motion and neck supple. No rigidity.  Lymphadenopathy:     Cervical: No cervical adenopathy.  Skin:    General: Skin is warm and dry.  Neurological:     Mental Status: She is alert and oriented to person, place, and time.  Psychiatric:        Behavior: Behavior normal.      UC Treatments / Results  Labs (all labs ordered are listed, but only abnormal results are displayed) Labs Reviewed - No data to display  EKG   Radiology No results found.  Procedures Procedures (including critical care time)  Medications Ordered in UC Medications  ketorolac (TORADOL) injection 30 mg (has no administration in time range)    Initial Impression / Assessment and Plan / UC Course  I have reviewed the triage vital signs and the nursing notes.  Pertinent labs & imaging results that were available during my care of the patient were reviewed by me and considered in my medical decision making (see chart for details).     Hx and exam c/w sinusitis Will start pt on Augmentin  F/u with PCP as needed  Final Clinical Impressions(s) / UC Diagnoses   Final diagnoses:  Acute non-recurrent maxillary sinusitis     Discharge Instructions      Please take antibiotics as prescribed and be sure to complete entire course even if you start to feel better to ensure infection does not come back.  You may take 500mg  acetaminophen every 4-6 hours or in combination with ibuprofen 400-600mg  every 6-8 hours as needed for pain, inflammation, and fever.  Be sure to well hydrated with clear liquids and get at least 8 hours of sleep at night, preferably more while sick.   Please follow up with family medicine in 1 week if needed.     ED Prescriptions    Medication Sig Dispense Auth. Provider   amoxicillin-clavulanate (AUGMENTIN) 875-125 MG tablet  (Status: Discontinued) Take 1 tablet by mouth 2 (two) times daily. One po bid x 7 days 14 tablet , Joss Friedel O, PA-C    amoxicillin-clavulanate (AUGMENTIN) 875-125 MG tablet Take 1 tablet by mouth 2 (two) times daily. One po bid x 7 days 14 tablet 12-[birth year]-1986, Lurene Shadow     PDMP not reviewed this encounter.   [place], Lurene Shadow 01/14/20 1538
